# Patient Record
Sex: Female | Born: 1995 | Race: Black or African American | Hispanic: No | Marital: Single | Smoking: Former smoker
Health system: Southern US, Community
[De-identification: ages and names within clinical notes are randomized; demographics above are authoritative.]

## PROBLEM LIST (undated history)

## (undated) DIAGNOSIS — B009 Herpesviral infection, unspecified: Secondary | ICD-10-CM

## (undated) DIAGNOSIS — D573 Sickle-cell trait: Secondary | ICD-10-CM

## (undated) HISTORY — DX: Herpesviral infection, unspecified: B00.9

## (undated) HISTORY — PX: NO PAST SURGERIES: SHX2092

---
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
## 2017-01-14 ENCOUNTER — Emergency Department (HOSPITAL_COMMUNITY)
Admission: EM | Admit: 2017-01-14 | Discharge: 2017-01-14 | Disposition: A | Discharge status: Home / Self Care | Payer: Medicaid Other | Attending: Emergency Medicine | Admitting: Emergency Medicine

## 2017-01-14 ENCOUNTER — Encounter (HOSPITAL_COMMUNITY): Payer: Self-pay

## 2017-01-14 DIAGNOSIS — N939 Abnormal uterine and vaginal bleeding, unspecified: Secondary | ICD-10-CM | POA: Diagnosis not present

## 2017-01-14 DIAGNOSIS — Z79899 Other long term (current) drug therapy: Secondary | ICD-10-CM | POA: Insufficient documentation

## 2017-01-14 DIAGNOSIS — L210 Seborrhea capitis: Secondary | ICD-10-CM

## 2017-01-14 DIAGNOSIS — L42 Pityriasis rosea: Secondary | ICD-10-CM | POA: Insufficient documentation

## 2017-01-14 DIAGNOSIS — R21 Rash and other nonspecific skin eruption: Secondary | ICD-10-CM | POA: Diagnosis present

## 2017-01-14 MED ORDER — HYDROXYZINE HCL 25 MG PO TABS: 25.0000 mg | ORAL_TABLET | Freq: Four times a day (QID) | ORAL | 0 refills | Status: DC

## 2017-01-14 NOTE — Discharge Instructions (Signed)
How is this treated? Usually, treatment is not needed for this condition. The rash will probably go away on its own in 4-8 weeks. In some cases, a health care provider may recommend or prescribe medicine to reduce itching. Follow these instructions at home:  Take medicines only as directed by your health care provider.  Avoid scratching the affected areas of skin.  Do not take hot baths or use a sauna. Use only warm water when bathing or showering. Heat can increase itching. Contact a health care provider if:  Your rash does not go away in 8 weeks.  Your rash gets much worse.  You have a fever.  You have swelling or pain in the rash area.  You have fluid, blood, or pus coming from the rash area.

## 2017-01-14 NOTE — ED Provider Notes (Signed)
[facility] Provider Note    By signing my name below, I, Earmon Phoenix, attest that this documentation has been prepared under the direction and in the presence of Arthor Captain, PA-C. Electronically Signed: Earmon Phoenix, ED Scribe. 01/14/17. 3:22 PM.   History   Chief Complaint Chief Complaint  Patient presents with  . Rash    HPI  Theresa Lam is a 21 y.o. female who presents to the Emergency Department complaining of a worsening rash to the face and chest that has been present for the past two weeks. She reports associated itching. She also reports she has recently started her period for the third time in one month. She states she received the Depo Provera injection 6 months ago and is no longer on any type of birth control. She has not taken anything for her symptoms. There are no modifying factors noted. She denies fever, chills, nausea, vomiting, difficulty breathing or swallowing, drainage or bleeding from the wounds. She does not have a OB/GYN and denies being sexually active in the last nine months.   Past Medical History:  Diagnosis Date  . Sickle cell trait [facility])     Patient Active Problem List   Diagnosis Date Noted  . Fetal distress 03/30/2016  . Supervision of normal first pregnancy 09/08/2015    Past Surgical History:  Procedure Laterality Date  . NO PAST SURGERIES      OB History    Gravida Para Term Preterm AB Living   SAB TAB Ectopic Multiple Live Births         0 1       Home Medications    Prior to Admission medications   Medication Sig Start Date End Date Taking? Authorizing Provider  acetaminophen (TYLENOL) 500 MG tablet Take 1,000 mg by mouth every 6 (six) hours as needed for mild pain.    Historical Provider, MD  cephALEXin (KEFLEX) 500 MG capsule Take 1 capsule (500 mg total) by mouth 4 (four) times daily. Patient not taking: Reported on 06/20/2016 06/14/16   Danelle Berry, PA-C  hydrOXYzine  (ATARAX/VISTARIL) 25 MG tablet Take 1 tablet (25 mg total) by mouth every 6 (six) hours. 12/31/16   Deatra Canter, FNP  ibuprofen (ADVIL,MOTRIN) 600 MG tablet Take 1 tablet (600 mg total) by mouth every 6 (six) hours. Patient not taking: Reported on 06/20/2016 04/01/16   Montez Morita, CNM  medroxyPROGESTERone (DEPO-PROVERA) 150 MG/ML injection Inject 1 mL (150 mg total) into the muscle every 3 (three) months. 06/15/16   Brock Bad, MD  Prenatal Vit-Fe Phos-FA-Omega (VITAFOL GUMMIES) 3.33-0.333-34.8 MG CHEW Chew 3 tablets by mouth daily. 06/01/16   Brock Bad, MD  traMADol (ULTRAM) 50 MG tablet Take 1 tablet (50 mg total) by mouth every 12 (twelve) hours as needed for severe pain. Patient not taking: Reported on 06/20/2016 06/14/16   Danelle Berry, PA-C    Family History No family history on file.  Social History Social History  Substance Use Topics  . Smoking status: Never Smoker  . Smokeless tobacco: Never Used  . Alcohol use No     Allergies   Patient has no known allergies.   Review of Systems Review of Systems  Constitutional: Negative for chills and fever.  HENT: Negative for trouble swallowing.   Respiratory: Negative for shortness of breath.   Gastrointestinal: Negative for nausea and vomiting.  Skin: Positive for rash.     Physical Exam  Updated Vital Signs BP 115/73 (BP Location: Left Arm)   Pulse 66   Temp 98.3 F (36.8 C) (Oral)   Resp 18   Ht  (1.727 m)   Wt 157 lb (71.2 kg)   LMP 12/16/2016   SpO2 100%   BMI 23.87 kg/m   Physical Exam  Constitutional: She is oriented to person, place, and time. She appears well-developed and well-nourished.  HENT:  Head: Normocephalic and atraumatic.  Neck: Normal range of motion.  Cardiovascular: Normal rate.   Pulmonary/Chest: Effort normal.  Musculoskeletal: Normal range of motion.  Neurological: She is alert and oriented to person, place, and time.  Skin: Skin is warm and dry. Rash noted.    Multiple, singular, fine plaques with fine scale over face, chest, abdomen and upper arms. Large herald patch in left axilla.  Psychiatric: She has a normal mood and affect. Her behavior is normal.  Nursing note and vitals reviewed.    ED Treatments / Results  DIAGNOSTIC STUDIES: Oxygen Saturation is 100% on RA, normal by my interpretation.   COORDINATION OF CARE: 3:13 PM- Will treat symptomatically. Will refer to [facility] clinic. Pt verbalizes understanding and agrees to plan.  Medications - No data to display  Labs (all labs ordered are listed, but only abnormal results are displayed) Labs Reviewed - No data to display  EKG  EKG Interpretation None       Radiology No results found.  Procedures Procedures (including critical care time)  Medications Ordered in ED Medications - No data to display   Initial Impression / Assessment and Plan / ED Course  I have reviewed the triage vital signs and the nursing notes.  Pertinent labs & imaging results that were available during my care of the patient were reviewed by me and considered in my medical decision making (see chart for details).     Patient with pityriasis rosea. No signs of secondary infection. Discharged with symptomatic treatment. Follow up with PCP in 2-3 days. Return precautions discussed. Pt is safe for discharge at this time.     Final Clinical Impressions(s) / ED Diagnoses   Final diagnoses:  Pityriasis  Abnormal uterine bleeding    New Prescriptions New Prescriptions   No medications on file       Arthor Captain, PA-C 01/14/17 1622    Benjiman Core, MD 01/14/17 2012

## 2017-01-14 NOTE — ED Triage Notes (Signed)
Per Pt, Pt is coming from home with macular rash x 2 weeks. Denies any known allergies. Noted on chest and torso.

## 2017-06-06 ENCOUNTER — Emergency Department (HOSPITAL_COMMUNITY): Payer: Medicaid Other

## 2017-06-06 ENCOUNTER — Emergency Department (HOSPITAL_COMMUNITY)
Admission: EM | Admit: 2017-06-06 | Discharge: 2017-06-06 | Disposition: A | Discharge status: Home / Self Care | Payer: Medicaid Other | Attending: Emergency Medicine | Admitting: Emergency Medicine

## 2017-06-06 ENCOUNTER — Encounter (HOSPITAL_COMMUNITY): Payer: Self-pay | Admitting: Emergency Medicine

## 2017-06-06 DIAGNOSIS — Y9389 Activity, other specified: Secondary | ICD-10-CM | POA: Insufficient documentation

## 2017-06-06 DIAGNOSIS — Z79899 Other long term (current) drug therapy: Secondary | ICD-10-CM | POA: Insufficient documentation

## 2017-06-06 DIAGNOSIS — D573 Sickle-cell trait: Secondary | ICD-10-CM | POA: Insufficient documentation

## 2017-06-06 DIAGNOSIS — S99821A Other specified injuries of right foot, initial encounter: Secondary | ICD-10-CM | POA: Insufficient documentation

## 2017-06-06 DIAGNOSIS — W1789XA Other fall from one level to another, initial encounter: Secondary | ICD-10-CM | POA: Insufficient documentation

## 2017-06-06 DIAGNOSIS — S99921A Unspecified injury of right foot, initial encounter: Secondary | ICD-10-CM | POA: Diagnosis present

## 2017-06-06 DIAGNOSIS — Y929 Unspecified place or not applicable: Secondary | ICD-10-CM | POA: Insufficient documentation

## 2017-06-06 DIAGNOSIS — Y999 Unspecified external cause status: Secondary | ICD-10-CM | POA: Insufficient documentation

## 2017-06-06 MED ORDER — MELOXICAM 15 MG PO TABS: 15.0000 mg | ORAL_TABLET | Freq: Every day | ORAL | 0 refills | Status: DC

## 2017-06-06 NOTE — ED Triage Notes (Signed)
Pt reports jumping from chair to floor last night and injuring R foot. Swelling noted. Pulses and neuro intact.

## 2017-06-06 NOTE — ED Notes (Signed)
Ortho tech notified.  

## 2017-06-06 NOTE — ED Notes (Signed)
Ortho tech in. 

## 2017-06-06 NOTE — ED Notes (Signed)
Larey Seat out of a chair onto right foot last pm. Today right dorsal foot painful, swollen and discolored.

## 2017-06-06 NOTE — ED Provider Notes (Signed)
[facility] Provider Note   CSN: [id number] Arrival date & time: 06/06/17  1153     History   Chief Complaint Chief Complaint  Patient presents with  . Foot Injury    HPI Theresa Lam is a 21 y.o. female.Presents emergency Department with chief complaint of right foot pain. Patient states that she was standing on all stepstool yesterday when it fell. She jumped down and fell onto her right foot. She came down onto a pointed toe and had immediate severe pain and difficulty ambulating on the foot. She states that she has had severe mid foot and lateral foot pain with any attempt to ambulate. She has significant swelling. She denies any numbness, tingling. She has a history of previous ankle sprain.  HPI  Past Medical History:  Diagnosis Date  . Sickle cell trait [facility])     Patient Active Problem List   Diagnosis Date Noted  . Fetal distress 03/30/2016  . Supervision of normal first pregnancy 09/08/2015    Past Surgical History:  Procedure Laterality Date  . NO PAST SURGERIES      OB History    Gravida Para Term Preterm AB Living   SAB TAB Ectopic Multiple Live Births         0 1       Home Medications    Prior to Admission medications   Medication Sig Start Date End Date Taking? Authorizing Provider  acetaminophen (TYLENOL) 500 MG tablet Take 1,000 mg by mouth every 6 (six) hours as needed for mild pain.    [provider]  cephALEXin (KEFLEX) 500 MG capsule Take 1 capsule (500 mg total) by mouth 4 (four) times daily. Patient not taking: Reported on 06/20/2016 06/14/16   Danelle Berry, PA-C  hydrOXYzine (ATARAX/VISTARIL) 25 MG tablet Take 1 tablet (25 mg total) by mouth every 6 (six) hours. 01/14/17   Meadow Abramo, Cammy Copa, PA-C  ibuprofen (ADVIL,MOTRIN) 600 MG tablet Take 1 tablet (600 mg total) by mouth every 6 (six) hours. Patient not taking: Reported on 06/20/2016 04/01/16   Montez Morita, CNM  medroxyPROGESTERone (DEPO-PROVERA)  150 MG/ML injection Inject 1 mL (150 mg total) into the muscle every 3 (three) months. 06/15/16   Brock Bad, MD  meloxicam (MOBIC) 15 MG tablet Take 1 tablet (15 mg total) by mouth daily. Take 1 daily with food. 06/06/17   Arthor Captain, PA-C  Prenatal Vit-Fe Phos-FA-Omega (VITAFOL GUMMIES) 3.33-0.333-34.8 MG CHEW Chew 3 tablets by mouth daily. 06/01/16   Brock Bad, MD  traMADol (ULTRAM) 50 MG tablet Take 1 tablet (50 mg total) by mouth every 12 (twelve) hours as needed for severe pain. Patient not taking: Reported on 06/20/2016 06/14/16   Danelle Berry, PA-C    Family History No family history on file.  Social History Social History  Substance Use Topics  . Smoking status: Never Smoker  . Smokeless tobacco: Never Used  . Alcohol use No     Allergies   Patient has no known allergies.   Review of Systems Review of Systems  Musculoskeletal: Positive for gait problem and joint swelling.  Neurological: Negative for weakness and numbness.     Physical Exam Updated Vital Signs BP 103/65 (BP Location: Left Arm)   Pulse (!) 59   Temp 98.5 F (36.9 C) (Oral)   Resp 16   LMP 04/18/2017 (Approximate)   SpO2 100%   Physical Exam  Constitutional: She is oriented to  person, place, and time. She appears well-developed and well-nourished. No distress.  HENT:  Head: Normocephalic and atraumatic.  Eyes: Conjunctivae are normal. No scleral icterus.  Neck: Normal range of motion.  Cardiovascular: Normal rate, regular rhythm and normal heart sounds.  Exam reveals no gallop and no friction rub.   No murmur heard. Pulmonary/Chest: Effort normal and breath sounds normal. No respiratory distress.  Abdominal: Soft. Bowel sounds are normal. She exhibits no distension and no mass. There is no tenderness. There is no guarding.  Musculoskeletal:  Patient pulling tender over the proximal fifth and fourth metatarsals. There is marked swelling and ecchymosis.NVI Normal ROM of ankle    Neurological: She is alert and oriented to person, place, and time.  Skin: Skin is warm and dry. She is not diaphoretic.  Psychiatric: Her behavior is normal.  Nursing note and vitals reviewed.    ED Treatments / Results  Labs (all labs ordered are listed, but only abnormal results are displayed) Labs Reviewed - No data to display  EKG  EKG Interpretation None       Radiology Dg Foot Complete Right  Result Date: 06/06/2017 CLINICAL DATA:  Pain following fall EXAM: RIGHT FOOT COMPLETE - 3+ VIEW COMPARISON:  None. FINDINGS: Frontal, oblique, and lateral views were obtained. There is mild soft tissue swelling over the dorsal forefoot. There is no fracture or dislocation. Joint spaces appear normal. No erosive change. IMPRESSION: Soft tissue swelling dorsal forefoot region. No fracture or dislocation. No evident arthropathic change. Electronically Signed   By: Bretta Bang III M.D.   On: 06/06/2017 13:19    Procedures Procedures (including critical care time)  Medications Ordered in ED Medications - No data to display  SPLINT APPLICATION Date/Time: 3:51 PM Authorized by: Arthor Captain Consent: Verbal consent obtained. Risks and benefits: risks, benefits and alternatives were discussed Consent given by: patient Splint applied by: orthopedic technician Location details: R foot Splint type: short leg Post-procedure: The splinted body part was neurovascularly unchanged following the procedure. Patient tolerance: Patient tolerated the procedure well with no immediate complications.    Initial Impression / Assessment and Plan / ED Course  I have reviewed the triage vital signs and the nursing notes.  Pertinent labs & imaging results that were available during my care of the patient were reviewed by me and considered in my medical decision making (see chart for details).     .Patient with mid foot injury. I have concern for potential Lisfranc fracture or occult  fracture of the mid foot at this time. Patient will be placed in a stirrup and posterior splint, crutches and nonweightbearing. She is to follow-up with orthopedics in about 1 week. I discussed return precautions with the patient she is neurovascularly intact after splint application. Safe for discharge at this time  Final Clinical Impressions(s) / ED Diagnoses   Final diagnoses:  Injury of right foot, initial encounter    New Prescriptions New Prescriptions   MELOXICAM (MOBIC) 15 MG TABLET    Take 1 tablet (15 mg total) by mouth daily. Take 1 daily with food.     Arthor Captain, PA-C 06/06/17 1551    Azalia Bilis, MD 06/06/17 1731

## 2017-06-06 NOTE — Progress Notes (Signed)
Orthopedic Tech Progress Note Patient Details:  Theresa Lam 1996/09/14 [id number]  Ortho Devices Type of Ortho Device: Crutches, Post (short leg) splint Ortho Device/Splint Location: applied short leg splint (with stirrup) TO PT right leg/ankle.  pt tolerated application well.  fitted and trained pt for crutch use.  pt ambulated very well.  right leg. Ortho Device/Splint Interventions: Application, Adjustment   [name] 06/06/2017, 3:21 PM

## 2017-06-06 NOTE — Discharge Instructions (Signed)
Do not bear weight on the Foot.  Follow closely with Dr. Aundria Rud. Turned to the emergency department if you develop any numbness, tingling, pain out of proportion.

## 2017-11-11 IMAGING — DX DG CHEST 2V
2 series · 2 of 2 positions shown · non-contrast
Comparison: 06/13/2016

CLINICAL DATA: Fever starting last night, cough

EXAM:
CHEST  2 VIEW

[w chest pa]
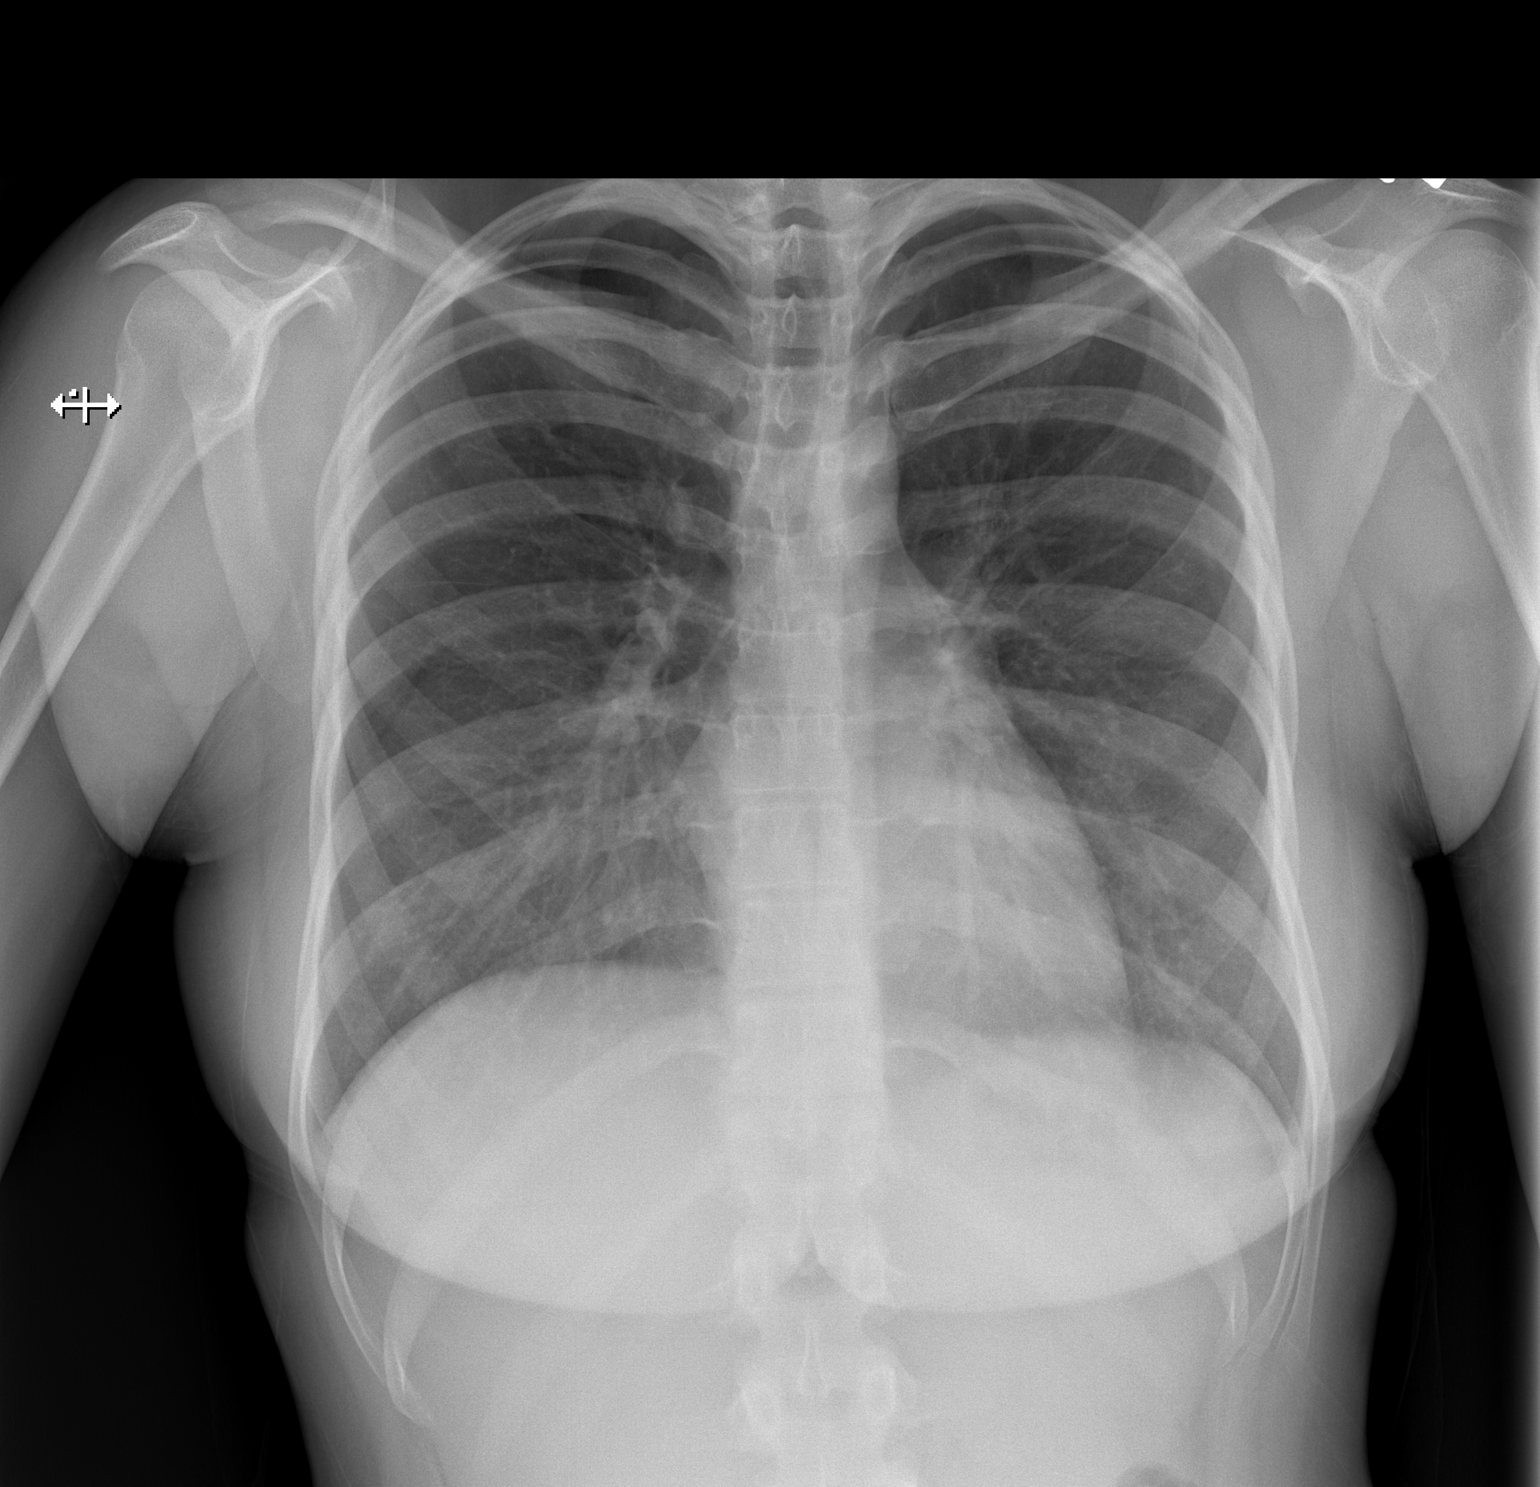

[w chest lat]
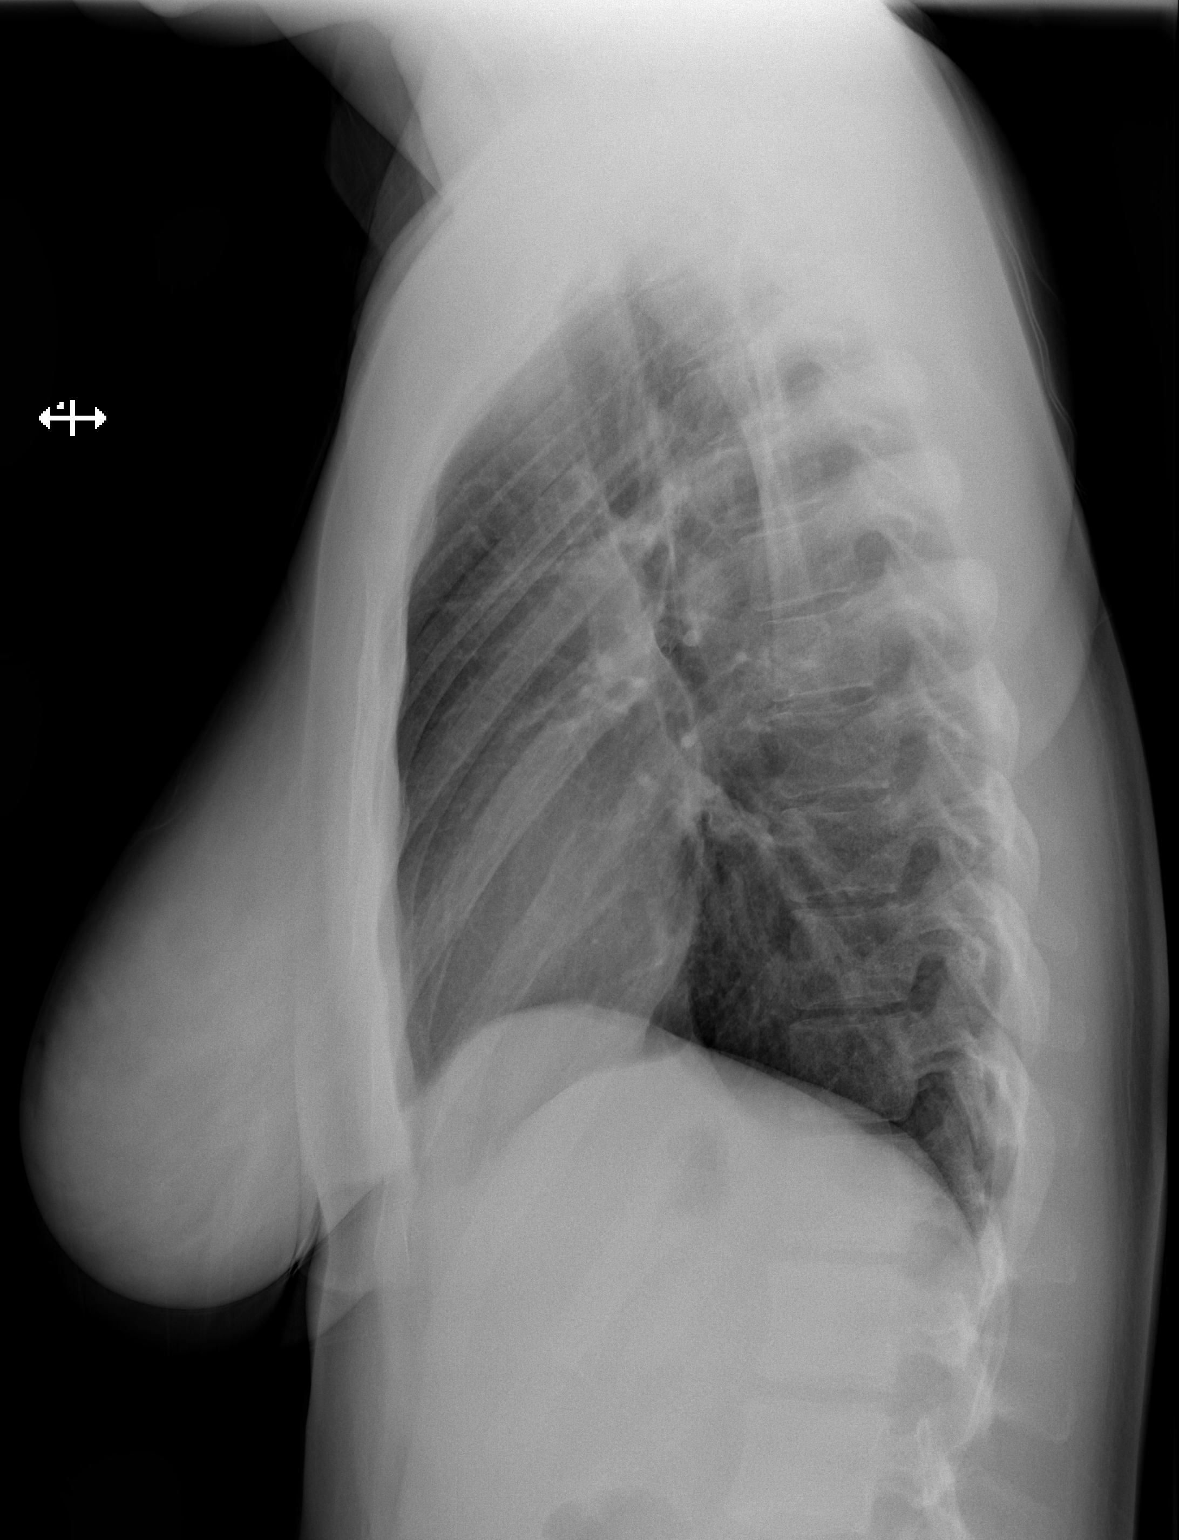

[2 of 2 positions shown; findings below may reference images not displayed]

FINDINGS: Cardiomediastinal silhouette is stable. No acute infiltrate or
pleural effusion. No pulmonary edema. Bony thorax is unremarkable.
IMPRESSION: No active cardiopulmonary disease.

## 2017-11-11 IMAGING — DX DG ABDOMEN 1V
1 series · 1 of 1 positions shown · non-contrast
Comparison: Abdomen 08/01/2014.

CLINICAL DATA: Fever.

EXAM:
ABDOMEN - 1 VIEW

[t abdomen supine]
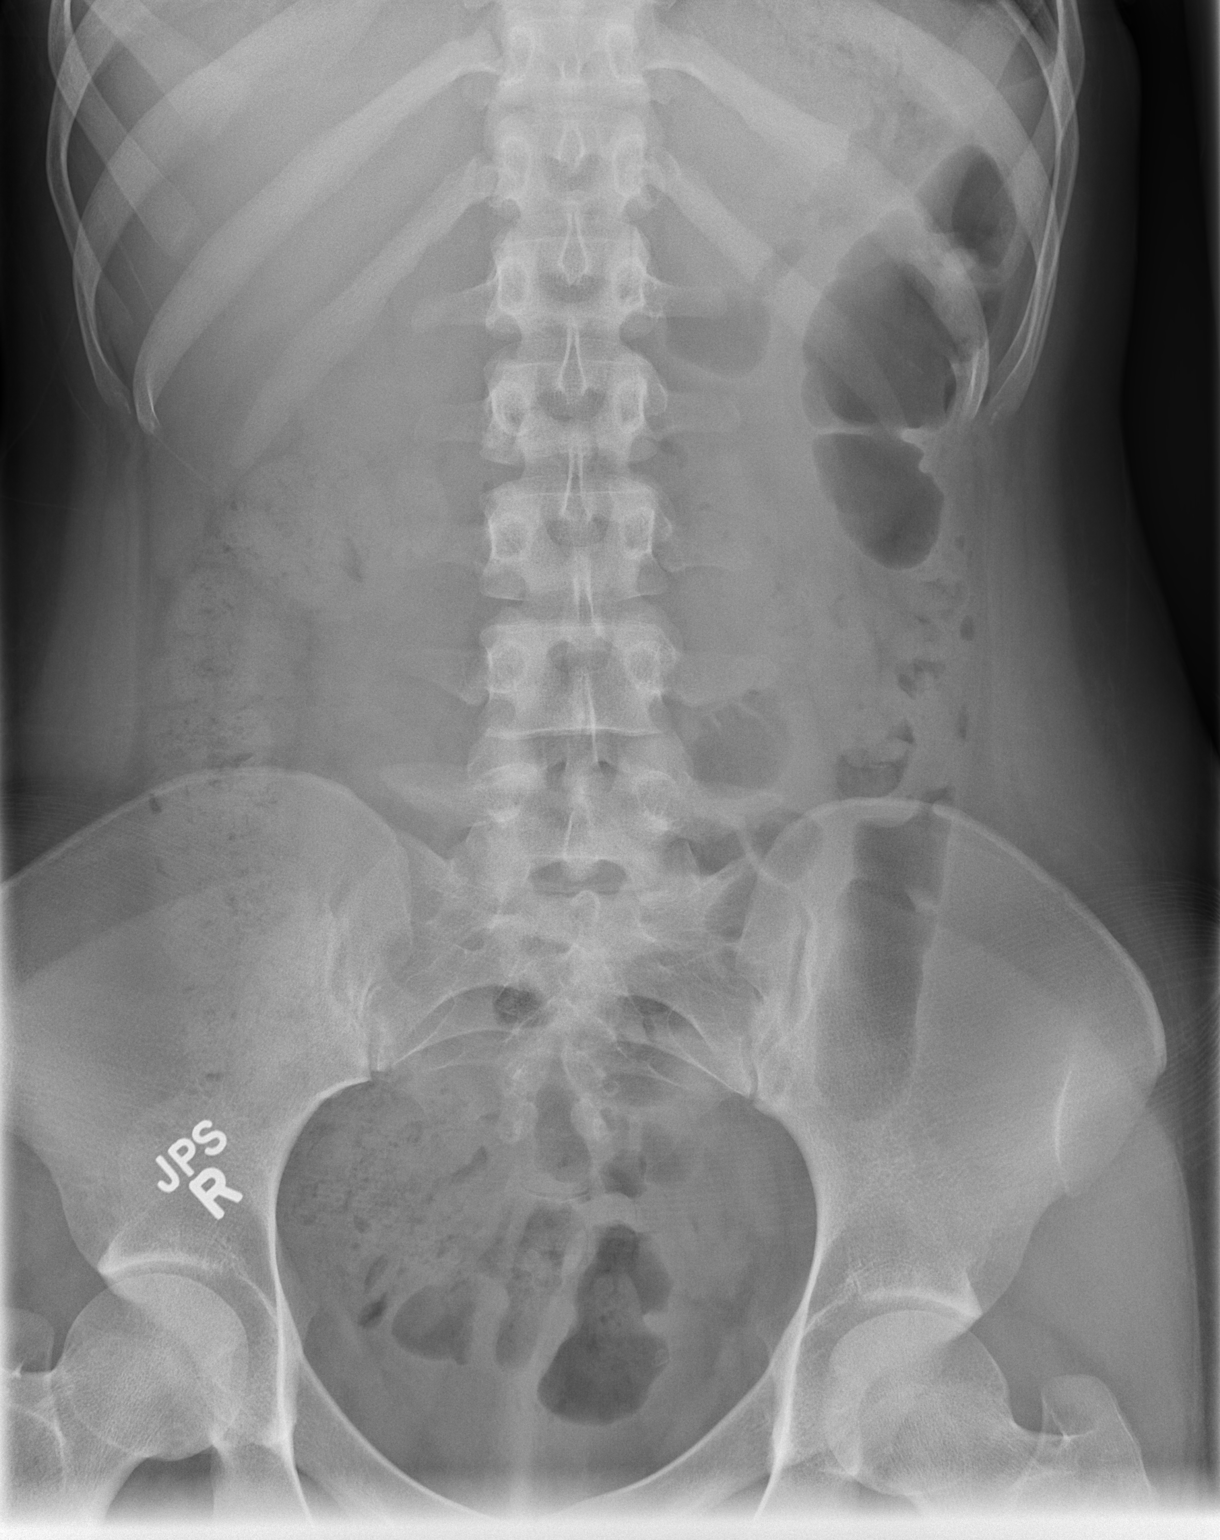

[1 of 1 positions shown; findings below may reference images not displayed]

FINDINGS: Soft tissue structures are unremarkable. No bowel distention. No
free air. Stool noted throughout the colon. No acute bony
abnormality .
IMPRESSION: Stool noted throughout the colon.  No acute abnormality.

## 2019-09-01 ENCOUNTER — Other Ambulatory Visit: Payer: Self-pay

## 2019-09-01 ENCOUNTER — Encounter (HOSPITAL_COMMUNITY): Payer: Self-pay

## 2019-09-01 ENCOUNTER — Ambulatory Visit (HOSPITAL_COMMUNITY)
Admission: EM | Admit: 2019-09-01 | Discharge: 2019-09-01 | Disposition: A | Discharge status: Home / Self Care | Payer: Medicaid Other | Attending: Family Medicine | Admitting: Family Medicine

## 2019-09-01 DIAGNOSIS — J020 Streptococcal pharyngitis: Secondary | ICD-10-CM | POA: Insufficient documentation

## 2019-09-01 DIAGNOSIS — J029 Acute pharyngitis, unspecified: Secondary | ICD-10-CM | POA: Diagnosis not present

## 2019-09-01 DIAGNOSIS — Z793 Long term (current) use of hormonal contraceptives: Secondary | ICD-10-CM | POA: Insufficient documentation

## 2019-09-01 DIAGNOSIS — D573 Sickle-cell trait: Secondary | ICD-10-CM | POA: Diagnosis not present

## 2019-09-01 DIAGNOSIS — Z20828 Contact with and (suspected) exposure to other viral communicable diseases: Secondary | ICD-10-CM | POA: Diagnosis not present

## 2019-09-01 DIAGNOSIS — F17210 Nicotine dependence, cigarettes, uncomplicated: Secondary | ICD-10-CM | POA: Diagnosis not present

## 2019-09-01 LAB — POCT RAPID STREP A: Streptococcus, Group A Screen (Direct): POSITIVE — AB

## 2019-09-01 MED ORDER — AMOXICILLIN 500 MG PO CAPS: 1000.0000 mg | ORAL_CAPSULE | Freq: Every day | ORAL | 0 refills | Status: AC

## 2019-09-01 MED ORDER — IBUPROFEN 600 MG PO TABS: 600.0000 mg | ORAL_TABLET | Freq: Four times a day (QID) | ORAL | 0 refills | Status: DC

## 2019-09-01 NOTE — Discharge Instructions (Signed)
Treating you for strep throat.  Take the medication as prescribed. Follow up as needed for continued or worsening symptoms

## 2019-09-01 NOTE — ED Provider Notes (Signed)
[facility]    CSN: [id number] Arrival date & time: 09/01/19  1059      History   Chief Complaint Chief Complaint  Patient presents with  . Sore Throat    HPI Theresa Lam is a 23 y.o. female.   Pt is a 23 year old female the presents today for 2 days sore throat. Symptoms have been constant. She has been using Motrin without much relief. Reporting white patches in the back of her throat. Denies any trouble swallowing or breathing. No fever, chills or body aches. Low grade fever here today.   ROS per HPI      Past Medical History:  Diagnosis Date  . Sickle cell trait [facility])     Patient Active Problem List   Diagnosis Date Noted  . Fetal distress 03/30/2016  . Supervision of normal first pregnancy 09/08/2015    Past Surgical History:  Procedure Laterality Date  . NO PAST SURGERIES      OB History    Gravida  1   Para  1   Term  1   Preterm      AB      Living  1     SAB      TAB      Ectopic      Multiple  0   Live Births  1            Home Medications    Prior to Admission medications   Medication Sig Start Date End Date Taking? Authorizing Provider  acetaminophen (TYLENOL) 500 MG tablet Take 1,000 mg by mouth every 6 (six) hours as needed for mild pain.    [provider]  amoxicillin (AMOXIL) 500 MG capsule Take 2 capsules (1,000 mg total) by mouth daily for 10 days. 09/01/19 09/11/19  Dahlia Byes A, NP  hydrOXYzine (ATARAX/VISTARIL) 25 MG tablet Take 1 tablet (25 mg total) by mouth every 6 (six) hours. 01/14/17   Harris, Cammy Copa, PA-C  ibuprofen (ADVIL) 600 MG tablet Take 1 tablet (600 mg total) by mouth every 6 (six) hours. 09/01/19   Dahlia Byes A, NP  medroxyPROGESTERone (DEPO-PROVERA) 150 MG/ML injection Inject 1 mL (150 mg total) into the muscle every 3 (three) months. 06/15/16   Brock Bad, MD  Prenatal Vit-Fe Phos-FA-Omega (VITAFOL GUMMIES) 3.33-0.333-34.8 MG CHEW Chew 3 tablets by mouth  daily. 06/01/16   Brock Bad, MD    Family History Family History  Problem Relation Age of Onset  . Healthy Mother   . Cancer Father     Social History Social History   Tobacco Use  . Smoking status: Current Every Day Smoker    Types: Cigars  . Smokeless tobacco: Never Used  Substance Use Topics  . Alcohol use: No  . Drug use: Yes    Types: Marijuana     Allergies   Patient has no known allergies.   Review of Systems Review of Systems   Physical Exam Triage Vital Signs ED Triage Vitals  Enc Vitals Group     BP 09/01/19 1221 110/60     Pulse Rate 09/01/19 1221 64     Resp 09/01/19 1221 15     Temp 09/01/19 1221 99.1 F (37.3 C)     Temp Source 09/01/19 1221 Oral     SpO2 09/01/19 1221 98 %     Weight --      Height --      Head Circumference --  Peak Flow --      Pain Score 09/01/19 1219 9     Pain Loc --      Pain Edu? --      Excl. in [place]? --    No data found.  Updated Vital Signs BP 110/60 (BP Location: Right Arm)   Pulse 64   Temp 99.1 F (37.3 C) (Oral)   Resp 15   SpO2 98%   Visual Acuity Right Eye Distance:   Left Eye Distance:   Bilateral Distance:    Right Eye Near:   Left Eye Near:    Bilateral Near:     Physical Exam Vitals and nursing note reviewed.  Constitutional:      General: She is not in acute distress.    Appearance: She is not ill-appearing, toxic-appearing or diaphoretic.  HENT:     Head: Normocephalic and atraumatic.     Mouth/Throat:     Pharynx: Uvula midline. Posterior oropharyngeal erythema present.     Tonsils: Tonsillar exudate present. 2+ on the right. 2+ on the left.  Eyes:     Conjunctiva/sclera: Conjunctivae normal.  Pulmonary:     Effort: Pulmonary effort is normal.  Skin:    General: Skin is warm and dry.  Neurological:     Mental Status: She is alert.  Psychiatric:        Mood and Affect: Mood normal.      UC Treatments / Results  Labs (all labs ordered are listed, but only  abnormal results are displayed) Labs Reviewed  POCT RAPID STREP A - Abnormal; Notable for the following components:      Result Value   Streptococcus, Group A Screen (Direct) POSITIVE (*)    All other components within normal limits  NOVEL CORONAVIRUS, NAA (HOSP ORDER, SEND-OUT TO REF LAB; TAT 18-24 HRS)    EKG   Radiology No results found.  Procedures Procedures (including critical care time)  Medications Ordered in UC Medications - No data to display  Initial Impression / Assessment and Plan / UC Course  I have reviewed the triage vital signs and the nursing notes.  Pertinent labs & imaging results that were available during my care of the patient were reviewed by me and considered in my medical decision making (see chart for details).     Strep throat-rapid strep test positive here. Treating with amoxicillin Advil for pain, inflammation Recommend warm salt water gargles Follow up as needed for continued or worsening symptoms  Final Clinical Impressions(s) / UC Diagnoses   Final diagnoses:  Strep throat     Discharge Instructions     Treating you for strep throat.  Take the medication as prescribed. Follow up as needed for continued or worsening symptoms     ED Prescriptions    Medication Sig Dispense Auth. Provider   ibuprofen (ADVIL) 600 MG tablet Take 1 tablet (600 mg total) by mouth every 6 (six) hours. 30 tablet Clarissia Mckeen A, NP   amoxicillin (AMOXIL) 500 MG capsule Take 2 capsules (1,000 mg total) by mouth daily for 10 days. 20 capsule Loura Halt A, NP     PDMP not reviewed this encounter.   Orvan July, NP 09/02/19 [phone]

## 2019-09-01 NOTE — ED Triage Notes (Signed)
Patient presents to Urgent Care with complaints of sore throat since two days ago. Patient reports she has been using a throat spray and taking motrin for her pain but it's not helping, pt states she can see white patches on the back of her throat.

## 2019-09-03 LAB — NOVEL CORONAVIRUS, NAA (HOSP ORDER, SEND-OUT TO REF LAB; TAT 18-24 HRS): SARS-CoV-2, NAA: NOT DETECTED

## 2020-09-15 ENCOUNTER — Emergency Department (HOSPITAL_COMMUNITY)
Admission: EM | Admit: 2020-09-15 | Discharge: 2020-09-15 | Disposition: A | Discharge status: Home / Self Care | Payer: Medicaid Other | Attending: Emergency Medicine | Admitting: Emergency Medicine

## 2020-09-15 ENCOUNTER — Other Ambulatory Visit: Payer: Self-pay

## 2020-09-15 DIAGNOSIS — Z5321 Procedure and treatment not carried out due to patient leaving prior to being seen by health care provider: Secondary | ICD-10-CM | POA: Diagnosis not present

## 2020-09-15 DIAGNOSIS — M791 Myalgia, unspecified site: Secondary | ICD-10-CM | POA: Diagnosis not present

## 2020-09-15 NOTE — ED Triage Notes (Signed)
Patient complains of body aches that started this morning, states her daughter tested postive for COVID yesterday.

## 2020-09-15 NOTE — ED Notes (Signed)
Pt no answer for ready room.  

## 2021-11-20 ENCOUNTER — Inpatient Hospital Stay (HOSPITAL_COMMUNITY): Payer: Medicaid Other

## 2021-11-20 ENCOUNTER — Encounter (HOSPITAL_COMMUNITY): Payer: Self-pay | Admitting: *Deleted

## 2021-11-20 ENCOUNTER — Inpatient Hospital Stay (HOSPITAL_COMMUNITY)
Admission: AD | Admit: 2021-11-20 | Discharge: 2021-11-20 | Disposition: A | Discharge status: Home / Self Care | Payer: Medicaid Other | Attending: Family Medicine | Admitting: Family Medicine

## 2021-11-20 ENCOUNTER — Other Ambulatory Visit: Payer: Self-pay

## 2021-11-20 DIAGNOSIS — O26891 Other specified pregnancy related conditions, first trimester: Secondary | ICD-10-CM | POA: Diagnosis not present

## 2021-11-20 DIAGNOSIS — R109 Unspecified abdominal pain: Secondary | ICD-10-CM

## 2021-11-20 DIAGNOSIS — O219 Vomiting of pregnancy, unspecified: Secondary | ICD-10-CM | POA: Diagnosis not present

## 2021-11-20 DIAGNOSIS — Z3491 Encounter for supervision of normal pregnancy, unspecified, first trimester: Secondary | ICD-10-CM | POA: Diagnosis not present

## 2021-11-20 DIAGNOSIS — Z3A01 Less than 8 weeks gestation of pregnancy: Secondary | ICD-10-CM | POA: Diagnosis not present

## 2021-11-20 LAB — URINALYSIS, ROUTINE W REFLEX MICROSCOPIC
Bilirubin Urine: NEGATIVE
Glucose, UA: NEGATIVE mg/dL
Hgb urine dipstick: NEGATIVE
Ketones, ur: NEGATIVE mg/dL
Nitrite: NEGATIVE
Protein, ur: NEGATIVE mg/dL
Specific Gravity, Urine: 1.016 (ref 1.005–1.030)
pH: 6 (ref 5.0–8.0)

## 2021-11-20 LAB — CBC
HCT: 35 % — ABNORMAL LOW (ref 36.0–46.0)
Hemoglobin: 11.7 g/dL — ABNORMAL LOW (ref 12.0–15.0)
MCH: 27.9 pg (ref 26.0–34.0)
MCHC: 33.4 g/dL (ref 30.0–36.0)
MCV: 83.5 fL (ref 80.0–100.0)
Platelets: 187 10*3/uL (ref 150–400)
RBC: 4.19 MIL/uL (ref 3.87–5.11)
RDW: 13.4 % (ref 11.5–15.5)
WBC: 5 10*3/uL (ref 4.0–10.5)
nRBC: 0 % (ref 0.0–0.2)

## 2021-11-20 LAB — RH IG WORKUP (INCLUDES ABO/RH)
ABO/RH(D): B NEG
Antibody Screen: NEGATIVE
Gestational Age(Wks): 7
Unit division: 0

## 2021-11-20 LAB — WET PREP, GENITAL
Clue Cells Wet Prep HPF POC: NONE SEEN
Sperm: NONE SEEN
Trich, Wet Prep: NONE SEEN
WBC, Wet Prep HPF POC: >=10 — AB (ref <10)
Yeast Wet Prep HPF POC: NONE SEEN

## 2021-11-20 LAB — POCT PREGNANCY, URINE: Preg Test, Ur: POSITIVE — AB

## 2021-11-20 LAB — HCG, QUANTITATIVE, PREGNANCY: hCG, Beta Chain, Quant, S: 71621 m[IU]/mL — ABNORMAL HIGH (ref <5)

## 2021-11-20 MED ORDER — DOXYLAMINE-PYRIDOXINE 10-10 MG PO TBEC: 2.0000 | DELAYED_RELEASE_TABLET | Freq: Every day | ORAL | 5 refills | Status: DC

## 2021-11-20 NOTE — MAU Provider Note (Signed)
?History  ?  ? ?[id number] ? ?Arrival date and time: 11/20/21 1050 ?  ? ?Chief Complaint  ?Patient presents with  ? Abdominal Pain  ? ? ? ?HPI ?Theresa Lam is a 26 y.o. at [redacted]w[redacted]d by LMP with PMHx notable for sickle cell trait, who presents for abdominal cramping. ?Reports lower abdominal cramping started 2 days ago. Rates pain 5/10. Has been taking extra strength tylenol with some relief. Also reports some nausea, no vomiting.   ?Denies fever, diarrhea, constipation, dysuria, vaginal bleeding, or vaginal discharge. Does not have an OB. ? ?OB History   ? ? Gravida  ?2  ? Para  ?1  ? Term  ?1  ? Preterm  ?   ? AB  ?   ? Living  ?1  ?  ? ? SAB  ?   ? IAB  ?   ? Ectopic  ?   ? Multiple  ?0  ? Live Births  ?1  ?   ?  ?  ? ? ?Past Medical History:  ?Diagnosis Date  ? Sickle cell trait (HCC)   ? ? ?Past Surgical History:  ?Procedure Laterality Date  ? NO PAST SURGERIES    ? ? ?Family History  ?Problem Relation Age of Onset  ? Healthy Mother   ? Cancer Father   ? ? ?No Known Allergies ? ?No current facility-administered medications on file prior to encounter.  ? ?Current Outpatient Medications on File Prior to Encounter  ?Medication Sig Dispense Refill  ? acetaminophen (TYLENOL) 500 MG tablet Take 1,000 mg by mouth every 6 (six) hours as needed for mild pain.    ? Prenatal Vit-Fe Phos-FA-Omega (VITAFOL GUMMIES) 3.33-0.333-34.8 MG CHEW Chew 3 tablets by mouth daily. 90 tablet 12  ? valACYclovir (VALTREX) 1000 MG tablet valacyclovir 1 gram tablet ? TAKE 1 TABLET BY MOUTH EVERY 12 HOURS FOR 10 DAYS    ? ? ? ?ROS ?Pertinent positives and negative per HPI, all others reviewed and negative ? ?Physical Exam  ? ?BP 111/65 (BP Location: Right Arm)   Pulse 67   Temp 98.6 ?F (37 ?C) (Oral)   Resp 16   Ht 5\' 8"  (1.727 m)   Wt 75 kg   LMP 09/30/2021   SpO2 98%   BMI 25.13 kg/m?  ? ?Patient Vitals for the past 24 hrs: ? BP Temp Temp src Pulse Resp SpO2 Height Weight  ?11/20/21 1256 111/65 -- -- 67 16 -- -- --  ?11/20/21  1127 108/62 -- -- 65 16 98 % -- --  ?11/20/21 1111 109/61 98.6 ?F (37 ?C) Oral 71 18 99 % -- --  ?11/20/21 1105 -- -- -- -- -- -- 5\' 8"  (1.727 m) 75 kg  ? ? ?Physical Exam ?Vitals and nursing note reviewed.  ?Constitutional:   ?   General: She is not in acute distress. ?   Appearance: She is well-developed.  ?HENT:  ?   Head: Normocephalic and atraumatic.  ?Pulmonary:  ?   Effort: Pulmonary effort is normal. No respiratory distress.  ?Abdominal:  ?   General: Abdomen is flat. Bowel sounds are normal.  ?   Palpations: Abdomen is soft.  ?   Tenderness: There is no abdominal tenderness. There is no guarding or rebound.  ?Skin: ?   General: Skin is warm and dry.  ?Neurological:  ?   Mental Status: She is alert.  ?Psychiatric:     ?   Mood and Affect: Mood normal.     ?  Behavior: Behavior normal.  ? ? ?Labs ?Results for orders placed or performed during the hospital encounter of 11/20/21 (from the past 24 hour(s))  ?Pregnancy, urine POC     Status: Abnormal  ? Collection Time: 11/20/21 11:07 AM  ?Result Value Ref Range  ? Preg Test, Ur POSITIVE (A) NEGATIVE  ?Urinalysis, Routine w reflex microscopic Urine, Clean Catch     Status: Abnormal  ? Collection Time: 11/20/21 11:16 AM  ?Result Value Ref Range  ? Color, Urine YELLOW YELLOW  ? APPearance CLOUDY (A) CLEAR  ? Specific Gravity, Urine 1.016 1.005 - 1.030  ? pH 6.0 5.0 - 8.0  ? Glucose, UA NEGATIVE NEGATIVE mg/dL  ? Hgb urine dipstick NEGATIVE NEGATIVE  ? Bilirubin Urine NEGATIVE NEGATIVE  ? Ketones, ur NEGATIVE NEGATIVE mg/dL  ? Protein, ur NEGATIVE NEGATIVE mg/dL  ? Nitrite NEGATIVE NEGATIVE  ? Leukocytes,Ua LARGE (A) NEGATIVE  ? RBC / HPF 0-5 0 - 5 RBC/hpf  ? WBC, UA 11-20 0 - 5 WBC/hpf  ? Bacteria, UA RARE (A) NONE SEEN  ? Squamous Epithelial / LPF 21-50 0 - 5  ? Mucus PRESENT   ?Wet prep, genital     Status: Abnormal  ? Collection Time: 11/20/21 11:41 AM  ? Specimen: Vaginal  ?Result Value Ref Range  ? Yeast Wet Prep HPF POC NONE SEEN NONE SEEN  ? Trich, Wet Prep  NONE SEEN NONE SEEN  ? Clue Cells Wet Prep HPF POC NONE SEEN NONE SEEN  ? WBC, Wet Prep HPF POC >=10 (A) <10  ? Sperm NONE SEEN   ?CBC     Status: Abnormal  ? Collection Time: 11/20/21 11:57 AM  ?Result Value Ref Range  ? WBC 5.0 4.0 - 10.5 K/uL  ? RBC 4.19 3.87 - 5.11 MIL/uL  ? Hemoglobin 11.7 (L) 12.0 - 15.0 g/dL  ? HCT 35.0 (L) 36.0 - 46.0 %  ? MCV 83.5 80.0 - 100.0 fL  ? MCH 27.9 26.0 - 34.0 pg  ? MCHC 33.4 30.0 - 36.0 g/dL  ? RDW 13.4 11.5 - 15.5 %  ? Platelets 187 150 - 400 K/uL  ? nRBC 0.0 0.0 - 0.2 %  ?hCG, quantitative, pregnancy     Status: Abnormal  ? Collection Time: 11/20/21 11:57 AM  ?Result Value Ref Range  ? hCG, Beta Chain, Quant, S 71,621 (H) <5 mIU/mL  ?Rh IG workup (includes ABO/Rh)     Status: None  ? Collection Time: 11/20/21 11:57 AM  ?Result Value Ref Range  ? Gestational Age(Wks) 7   ? ABO/RH(D) B NEG   ? Antibody Screen NEG   ? Unit Number [id number]   ? Blood Component Type RHIG   ? Unit division 00   ? Status of Unit REL FROM [facility]   ? Transfusion Status    ?  OK TO TRANSFUSE ?Performed at [facility], [address], [place], [place] [address] ?  ? ? ?Imaging ?US OB LESS THAN 14 WEEKS WITH OB TRANSVAGINAL ? ?Result Date: 11/20/2021 ?CLINICAL DATA:  Nausea and cramping. Quantitative beta hCG pending at time dictation EXAM: OBSTETRIC <14 WK ULTRASOUND TECHNIQUE: Transabdominal ultrasound was performed for evaluation of the gestation as well as the maternal uterus and adnexal regions. COMPARISON:  None. FINDINGS: Intrauterine gestational sac: Single Yolk sac:  Visualized. Embryo:  Visualized. Cardiac Activity: Visualized. Heart Rate: 159 bpm CRL:   1.07 mm   7 w 1 d  [place] EDC: July 08, 2022 Subchorionic hemorrhage:  None visualized. Maternal uterus/adnexae: 3.1 cm right ovarian cyst. Left ovary is unremarkable. IMPRESSION: Early single viable intrauterine pregnancy. Electronically Signed   By: Maudry Mayhew M.D.   On: 11/20/2021 12:30   ? ?MAU Course   ?Procedures ?Lab Orders    ?     Wet prep, genital    ?     Culture, OB Urine    ?     Urinalysis, Routine w reflex microscopic Urine, Clean Catch    ?     CBC    ?     hCG, quantitative, pregnancy    ?     Pregnancy, urine POC    ?Meds ordered this encounter  ?Medications  ? Doxylamine-Pyridoxine (DICLEGIS) 10-10 MG TBEC  ?  Sig: Take 2 tablets by mouth at bedtime. If symptoms persist, add one tablet in the morning and one in the afternoon  ?  Dispense:  100 tablet  ?  Refill:  5  ?  Order Specific Question:   Supervising Provider  ?  Answer:   Levie Heritage [[id number]]  ? ?Imaging Orders    ?     US OB LESS THAN 14 WEEKS WITH OB TRANSVAGINAL    ? ? ?MDM ?Patient presents with abdominal cramping.  ?+UPT ?UA, wet prep, GC/chlamydia, CBC, ABO/Rh, quant hCG, and [place] today to rule out ectopic pregnancy which can be life threatening.  ? ?Ultrasound shows live IUP measuring consistently with dating by LMP.  ?U/a with large leuks. No urinary complaints. Will send urine culture.  ?Patient would like prescription for antiemetic ?Assessment and Plan  ? ?1. Abdominal pain during pregnancy in first trimester  ?-urine culture & GC/CT pendign ?-reviewed reasons to return to MAU  ?2. Normal IUP (intrauterine pregnancy) on prenatal ultrasound, first trimester   ?3. Nausea and vomiting during pregnancy prior to [redacted] weeks gestation  ?-rx diclegis. Also given instructions for OTC diclegis if insurance doesn't cover rx  ?4. [redacted] weeks gestation of pregnancy   ? ? ? ?Judeth Horn, NP ?11/20/21 ?7:21 PM ? ? ?

## 2021-11-20 NOTE — MAU Note (Addendum)
Theresa Lam is a 26 y.o. in MAU reporting: missed menstrual cycle, cramping  and nausea, denies vomiting.  Denies vaginal bleeding ?LMP: 09/30/2021 ?Onset of complaint: 2 days ago ?Pain score: 5 ? ?Lab orders placed from triage:   UPT & U/A ? ?VS: 98.6- 71-18-109/61 O2 Sats 99% ? ?

## 2021-11-20 NOTE — Discharge Instructions (Signed)
Nausea medication to take during pregnancy:  ?Unisom (doxylamine succinate 25 mg tablets) Take one tablet daily at bedtime. If symptoms are not adequately controlled, the dose can be increased to a maximum recommended dose of two tablets daily (1/2 tablet in the morning, 1/2 tablet mid-afternoon and one at bedtime). ?Vitamin B6 100mg  tablets. Take one tablet twice a day (up to 200 mg per day). ? ? ? ? ? ? Area Ob/Gyn Providers  ? ? ? ? ? ?   ?Center for [facility] at [facility]  [address] [place], [place]  [phone]  ?Center for [facility] at [facility]  [address], [place], [place]  [phone]  ?Center for [facility] at [facility] [address] [place], [place]  [phone]  ?Center for [facility] at [facility]  [address] [place], [place]  [phone]  ?Center for [phone] at [employer] [facility]  [address]First floor), [place], [place]  [phone]  ?Center for [facility] at [facility]  [address] [place], [place], [place]  [phone]  ?[facility]  [address], [place], [place]  [phone]  ?[facility]  [address] [place], [place], [place]  [phone]  ?[address] Ob/gyn  [address] [place], [place]  [phone]  ?[facility]  [address], [place], [place]  [phone]  ?[facility]  [address], [place], [place]  [phone]  ?[facility]   [address] [place], [place]  [phone]  ?[facility]  [address], [place], [place]   [phone]  ?[facility]  [address] [place], [place]  [phone]  ? ? ? ? ? ? ? ?

## 2021-11-21 LAB — GC/CHLAMYDIA PROBE AMP (~~LOC~~) NOT AT ARMC
Chlamydia: NEGATIVE
Comment: NEGATIVE
Comment: NORMAL
Neisseria Gonorrhea: NEGATIVE

## 2021-11-22 LAB — CULTURE, OB URINE: Culture: 20000 — AB

## 2022-04-03 ENCOUNTER — Ambulatory Visit (INDEPENDENT_AMBULATORY_CARE_PROVIDER_SITE_OTHER): Payer: Medicaid Other

## 2022-04-03 DIAGNOSIS — Z3201 Encounter for pregnancy test, result positive: Secondary | ICD-10-CM | POA: Diagnosis not present

## 2022-04-03 DIAGNOSIS — N912 Amenorrhea, unspecified: Secondary | ICD-10-CM | POA: Diagnosis not present

## 2022-04-03 DIAGNOSIS — Z3200 Encounter for pregnancy test, result unknown: Secondary | ICD-10-CM

## 2022-04-03 LAB — POCT URINE PREGNANCY: Preg Test, Ur: POSITIVE — AB

## 2022-04-03 NOTE — Progress Notes (Signed)
..  Theresa Lam presents today for UPT. She has no unusual complaints. LMP:01/28/22    OBJECTIVE: Appears well, in no apparent distress.  OB History     Gravida  3   Para  1   Term  1   Preterm      AB  1   Living  1      SAB      IAB      Ectopic      Multiple  0   Live Births  1          Home UPT Result:Positive In-Office UPT result:Positive I have reviewed the patient's medical, obstetrical, social, and family histories, and medications.   ASSESSMENT: Positive pregnancy test  PLAN Prenatal care to be completed at: [facility]

## 2022-04-05 ENCOUNTER — Ambulatory Visit (INDEPENDENT_AMBULATORY_CARE_PROVIDER_SITE_OTHER): Payer: Medicaid Other

## 2022-04-05 VITALS — BP 111/72 | HR 73 | Ht 68.0 in | Wt 164.5 lb

## 2022-04-05 DIAGNOSIS — Z3481 Encounter for supervision of other normal pregnancy, first trimester: Secondary | ICD-10-CM | POA: Diagnosis not present

## 2022-04-05 DIAGNOSIS — Z3480 Encounter for supervision of other normal pregnancy, unspecified trimester: Secondary | ICD-10-CM | POA: Insufficient documentation

## 2022-04-05 DIAGNOSIS — O3680X0 Pregnancy with inconclusive fetal viability, not applicable or unspecified: Secondary | ICD-10-CM

## 2022-04-05 DIAGNOSIS — Z3A09 9 weeks gestation of pregnancy: Secondary | ICD-10-CM

## 2022-04-05 MED ORDER — VITAFOL GUMMIES 3.33-0.333-34.8 MG PO CHEW: 3.0000 | CHEWABLE_TABLET | Freq: Every day | ORAL | 11 refills | Status: DC

## 2022-04-05 MED ORDER — GOJJI WEIGHT SCALE MISC: 1.0000 | 0 refills | Status: DC

## 2022-04-05 MED ORDER — BLOOD PRESSURE KIT DEVI: 1.0000 | 0 refills | Status: DC

## 2022-04-05 NOTE — Progress Notes (Signed)
New OB Intake  I connected with  Theresa Lam on 04/05/22 at  1:10 PM EDT by in person and verified that I am speaking with the correct person using two identifiers. Nurse is located at [facility] and pt is located at [place].  I discussed the limitations, risks, security and privacy concerns of performing an evaluation and management service by telephone and the availability of in person appointments. I also discussed with the patient that there may be a patient responsible charge related to this service. The patient expressed understanding and agreed to proceed.  I explained I am completing New OB Intake today. We discussed her EDD of 11/04/22 that is based on LMP of 01/28/22. Pt is G3/P1011. I reviewed her allergies, medications, Medical/Surgical/OB history, and appropriate screenings. I informed her of [facility] services. [facility] information placed in AVS. Based on history, this is a/an  pregnancy uncomplicated .   There are no problems to display for this patient.   Concerns addressed today  Delivery Plans Plans to deliver at [facility]. Patient given information for [facility] Healthy Baby website for more information about [facility]. Patient is not interested in water birth. Offered upcoming OB visit with CNM to discuss further.  MyChart/Babyscripts MyChart access verified. I explained pt will have some visits in office and some virtually. Babyscripts instructions given and order placed. Patient verifies receipt of registration text/e-mail. Account successfully created and app downloaded.  Blood Pressure Cuff/Weight Scale Blood pressure cuff ordered for patient to pick-up from [employer]. Explained after first prenatal appt pt will check weekly and document in Babyscripts. Patient does not  have weight scale. Weight scale ordered for patient to pick up from [employer].   Anatomy US Explained first scheduled [place] will be around 19 weeks. Dating and viability scan  performed today  Labs Discussed Theresa Lam genetic screening with patient. Would like both Panorama and Horizon drawn at new OB visit. Routine prenatal labs needed.  Covid Vaccine Patient has not covid vaccine.   Is patient a CenteringPregnancy candidate?  Interested Declined due to  Not a candidate due to  Centering Patient" indicated on sticky note  Social Determinants of Health Food Insecurity: Patient denies food insecurity. WIC Referral: Patient is interested in referral to [facility].  Transportation: Patient denies transportation needs. Childcare: Discussed no children allowed at ultrasound appointments. Offered childcare services; patient declines childcare services at this time.  First visit review I reviewed new OB appt with pt. I explained she will have a provider visit that includes prenatal labs, pap smear, std testing, and discuss plan of care with provider. Explained pt will be seen by Ndulue Chiagoziem at first visit; encounter routed to appropriate provider. Explained that patient will be seen by pregnancy navigator following visit with provider.   Theresa Capri, RN 04/05/2022  1:27 PM

## 2022-04-12 ENCOUNTER — Encounter: Payer: Self-pay | Admitting: *Deleted

## 2022-04-12 ENCOUNTER — Telehealth: Payer: Self-pay | Admitting: *Deleted

## 2022-04-12 NOTE — Telephone Encounter (Signed)
I was asked to Call Theresa Lam to give her more information about CenteringPregnancy. I called Theresa Lam and was unable to leave a message but heard message person unavailable. I will send a Mychart message. [name]

## 2022-04-18 ENCOUNTER — Encounter: Payer: Self-pay | Admitting: *Deleted

## 2022-04-23 ENCOUNTER — Encounter: Payer: Self-pay | Admitting: *Deleted

## 2022-05-10 ENCOUNTER — Encounter: Payer: Self-pay | Admitting: *Deleted

## 2022-05-11 ENCOUNTER — Ambulatory Visit (INDEPENDENT_AMBULATORY_CARE_PROVIDER_SITE_OTHER): Payer: Medicaid Other | Admitting: Family Medicine

## 2022-05-11 ENCOUNTER — Encounter: Payer: Self-pay | Admitting: Family Medicine

## 2022-05-11 ENCOUNTER — Other Ambulatory Visit (HOSPITAL_COMMUNITY)
Admission: RE | Admit: 2022-05-11 | Discharge: 2022-05-11 | Disposition: A | Discharge status: Home / Self Care | Payer: Medicaid Other | Source: Ambulatory Visit | Attending: Family Medicine | Admitting: Family Medicine

## 2022-05-11 VITALS — BP 110/70 | HR 82 | Wt 170.0 lb

## 2022-05-11 DIAGNOSIS — Z3480 Encounter for supervision of other normal pregnancy, unspecified trimester: Secondary | ICD-10-CM | POA: Diagnosis present

## 2022-05-11 DIAGNOSIS — Z3A14 14 weeks gestation of pregnancy: Secondary | ICD-10-CM

## 2022-05-11 DIAGNOSIS — Z3482 Encounter for supervision of other normal pregnancy, second trimester: Secondary | ICD-10-CM

## 2022-05-11 DIAGNOSIS — D573 Sickle-cell trait: Secondary | ICD-10-CM

## 2022-05-11 DIAGNOSIS — O99012 Anemia complicating pregnancy, second trimester: Secondary | ICD-10-CM

## 2022-05-11 DIAGNOSIS — O99019 Anemia complicating pregnancy, unspecified trimester: Secondary | ICD-10-CM

## 2022-05-11 DIAGNOSIS — Z8619 Personal history of other infectious and parasitic diseases: Secondary | ICD-10-CM

## 2022-05-11 NOTE — Progress Notes (Signed)
History:   Theresa Lam is a 26 y.o. G3P1011 at [redacted]w[redacted]d by LMP, consistent with 9 wk [place], being seen today for her first obstetrical visit.  Her obstetrical history is significant for  none . Patient does intend to breast feed. Pregnancy history fully reviewed.  Patient reports backache. Intermittent low back pain. No other symptoms. No cramping, nausea or vomiting, no vaginal bleeding. Generally healthy, with no signficant medical history.  She had her first baby 6 years ago., pregnancy and delivery were uncomplicated. Daughter was full term.  Was previously smoking cigarette and marijuana. Quit earlier this year after an unexpected pregnancy that was terminated.   HISTORY: OB History  Gravida Para Term Preterm AB Living  $Remov'3 1 1 'EMiqQB$ 0 1 1  SAB IAB Ectopic Multiple Live Births  0 0 0 0 1    # Outcome Date GA Lbr Len/2nd Weight Sex Delivery Anes PTL Lv  3 Current           2 AB 11/2021 [redacted]w[redacted]d    TAB     1 Term 03/31/16 [redacted]w[redacted]d 00:19 / 00:54 7 lb 12.3 oz (3.525 kg) F Vag-Spont Local  LIV     Birth Comments: Hgb, Normal, FA Newborn Screen Barcode: [id number] Date Collected: 04/01/2016     Name: [facility]     Apgar1: 8  Apgar5: 9    Last pap smear was done - no previous pap smear. Pap smear collected today.   Past Medical History:  Diagnosis Date   HSV-2 infection    Sickle cell trait ([place])    Past Surgical History:  Procedure Laterality Date   NO PAST SURGERIES     Family History  Problem Relation Age of Onset   Healthy Mother    Cancer Father    Hypertension Maternal Grandmother    Diabetes Paternal Grandfather    Social History   Tobacco Use   Smoking status: Former    Types: Cigars    Quit date: 2023    Years since quitting: 0.6   Smokeless tobacco: Never  Vaping Use   Vaping Use: Never used  Substance Use Topics   Alcohol use: Not Currently    Comment: last 10/27/21   Drug use: Not Currently    Types: Marijuana    Comment: daily, not since  confirmed pregnancy   No Known Allergies Current Outpatient Medications on File Prior to Visit  Medication Sig Dispense Refill   acetaminophen (TYLENOL) 500 MG tablet Take 1,000 mg by mouth every 6 (six) hours as needed for mild pain.     Blood Pressure Monitoring (BLOOD PRESSURE KIT) DEVI 1 kit by Does not apply route once a week. 1 each 0   Misc. Devices (GOJJI WEIGHT SCALE) MISC 1 Device by Does not apply route every 30 (thirty) days. 1 each 0   Prenatal Vit-Fe Phos-FA-Omega (VITAFOL GUMMIES) 3.33-0.333-34.8 MG CHEW Chew 3 tablets by mouth daily. 90 tablet 11   No current facility-administered medications on file prior to visit.    Review of Systems Pertinent items noted in HPI and remainder of comprehensive ROS otherwise negative.  Indications for ASA therapy (per UpToDate) One of the following: Previous pregnancy with preeclampsia, especially early onset and with an adverse outcome No Multifetal gestation No Chronic hypertension No Type 1 or 2 diabetes mellitus No Chronic kidney disease No Autoimmune disease (antiphospholipid syndrome, systemic lupus erythematosus) No Two or more of the following: Nulliparity No Obesity (body mass index >30 kg/m2) No Family history of  preeclampsia in mother or sister No Age ?35 years No Sociodemographic characteristics (African American race, low socioeconomic level) Yes Personal risk factors (eg, previous pregnancy with low birth weight or small for gestational age infant, previous adverse pregnancy outcome [eg, stillbirth], interval >10 years between pregnancies) No  Indications for early GDM screening (FBS, A1C, Random CBG, GTT) First-degree relative with diabetes Yes - 2 siblings with GDM BMI >30kg/m2 No Age > 35 No Previous birth of an infant weighing ?4000 g No Gestational diabetes mellitus in a previous pregnancy No Glycated hemoglobin ?5.7 percent (39 mmol/mol), impaired glucose tolerance, or impaired fasting glucose on previous  testing No High-risk race/ethnicity (eg, African American, Latino, Native American, Asian American, Pacific Islander) Yes Previous stillbirth of unknown cause No Maternal birthweight > 9 lbs No History of cardiovascular disease No Hypertension or on therapy for hypertension No High-density lipoprotein cholesterol level <35 mg/dL (0.90 mmol/L) and/or a triglyceride level >250 mg/dL (2.82 mmol/L) No Polycystic ovary syndrome No Physical inactivity No Other clinical condition associated with insulin resistance (eg, severe obesity, acanthosis nigricans) No Current use of glucocorticoids No   Physical Exam:   Vitals:   05/11/22 1041  BP: 110/70  Pulse: 82  Weight: 170 lb (77.1 kg)   Fetal Heart Rate (bpm): 157  Uterine size: Fundal Height: 14 cm   General: well-developed, well-nourished female in no acute distress  Breasts:  normal appearance, no masses or tenderness bilaterally, exam done in the presence of a chaperone.   Skin: normal coloration and turgor, no rashes  Neurologic: oriented, normal, negative, normal mood  Extremities: normal strength, tone, and muscle mass, ROM of all joints is normal  HEENT PERRLA, extraocular movement intact and sclera clear, anicteric  Neck supple and no masses  Cardiovascular: regular rate and rhythm  Respiratory:  no respiratory distress, normal breath sounds  Abdomen: soft, non-tender; bowel sounds normal; no masses,  no organomegaly  Pelvic: normal external genitalia, no lesions, normal vaginal mucosa, normal vaginal discharge, normal cervix, pap smear done. Exam done in the presence of a chaperone.     Assessment:    Pregnancy: G3P1011 Patient Active Problem List   Diagnosis Date Noted   History of herpes genitalis 05/11/2022   Supervision of other normal pregnancy, antepartum 04/05/2022     Plan:    1. Supervision of other normal pregnancy, antepartum Doing well. No concerns today. Measuring well for dates. FHR normal. Initial labs  drawn. Hgb A1c, given family history of GDM X2. Pap collected. Follow up in 4 weeks.   - CBC/D/Plt+RPR+Rh+ABO+RubIgG... - Cytology - PAP( [place]) - Culture, OB Urine - Cervicovaginal ancillary only( [place]) - HgB A1c - [place] MFM OB COMP + 14 WK; Future - PANORAMA PRENATAL TEST FULL PANEL - [facility]OR[facility] CUSTOM  2. [redacted] weeks gestation of pregnancy Anatomy scan ordered  3. History of herpes genitalis. No current lesions. Discussed need for valacyclovir for suppression of lesion from [redacted] weeks gestation.  4. Sickle cell Trait: Does not know genotype of FOB. Plans to ask.  Initial labs drawn. Continue prenatal vitamins. Problem list reviewed and updated. Genetic Screening discussed, Panorama and Horizon: requested. Ultrasound discussed; fetal anatomic survey: planned and ordered.. Anticipatory guidance about prenatal visits given including labs, ultrasounds, and testing.  Weight gain recommendations per IOM guidelines reviewed: underweight/BMI 18.5 or less > 28 - 40 lbs; normal weight/BMI 18.5 - 24.9 > 25 - 35 lbs; overweight/BMI 25 - 29.9 > 15 - 25 lbs; obese/BMI  30 or more > 11 -  20 lbs. Discussed usage of the Babyscripts app for more information about pregnancy, and to track blood pressures. Also discussed usage of virtual visits as additional source of managing and completing prenatal visits.  Patient was encouraged to use MyChart to review results, send requests, and have questions addressed.   The nature of [facility] Healthcare/Faculty Practice with multiple MDs and Advanced Practice Providers was explained to patient; also emphasized that residents, students are part of our team. Routine obstetric precautions reviewed. Encouraged to seek out care at our office or emergency room [facility] MAU preferred) for urgent and/or emergent concerns. Return in about 4 weeks (around 06/08/2022) for lob.     Liliane Channel MD MPH OB Fellow, [facility] for  [employer], [place]

## 2022-05-11 NOTE — Patient Instructions (Signed)
Schedule your anatomy scan

## 2022-05-12 LAB — HEMOGLOBIN A1C
Est. average glucose Bld gHb Est-mCnc: 97 mg/dL
Hgb A1c MFr Bld: 5 % (ref 4.8–5.6)

## 2022-05-12 LAB — CBC/D/PLT+RPR+RH+ABO+RUBIGG...
Antibody Screen: NEGATIVE
Basophils Absolute: 0 10*3/uL (ref 0.0–0.2)
Basos: 1 %
EOS (ABSOLUTE): 0.1 10*3/uL (ref 0.0–0.4)
Eos: 1 %
HCV Ab: NONREACTIVE
HIV Screen 4th Generation wRfx: NONREACTIVE
Hematocrit: 31.5 % — ABNORMAL LOW (ref 34.0–46.6)
Hemoglobin: 10.7 g/dL — ABNORMAL LOW (ref 11.1–15.9)
Hepatitis B Surface Ag: NEGATIVE
Immature Grans (Abs): 0 10*3/uL (ref 0.0–0.1)
Immature Granulocytes: 1 %
Lymphocytes Absolute: 1.1 10*3/uL (ref 0.7–3.1)
Lymphs: 21 %
MCH: 28.8 pg (ref 26.6–33.0)
MCHC: 34 g/dL (ref 31.5–35.7)
MCV: 85 fL (ref 79–97)
Monocytes Absolute: 0.4 10*3/uL (ref 0.1–0.9)
Monocytes: 8 %
Neutrophils Absolute: 3.7 10*3/uL (ref 1.4–7.0)
Neutrophils: 68 %
Platelets: 176 10*3/uL (ref 150–450)
RBC: 3.72 x10E6/uL — ABNORMAL LOW (ref 3.77–5.28)
RDW: 13 % (ref 11.7–15.4)
RPR Ser Ql: NONREACTIVE
Rh Factor: NEGATIVE
Rubella Antibodies, IGG: 3.56 index (ref >0.99)
WBC: 5.4 10*3/uL (ref 3.4–10.8)

## 2022-05-12 LAB — HCV INTERPRETATION

## 2022-05-14 ENCOUNTER — Other Ambulatory Visit: Payer: Self-pay | Admitting: Family Medicine

## 2022-05-14 DIAGNOSIS — B3731 Acute candidiasis of vulva and vagina: Secondary | ICD-10-CM

## 2022-05-14 DIAGNOSIS — O99019 Anemia complicating pregnancy, unspecified trimester: Secondary | ICD-10-CM

## 2022-05-14 DIAGNOSIS — A5901 Trichomonal vulvovaginitis: Secondary | ICD-10-CM

## 2022-05-14 LAB — URINE CULTURE, OB REFLEX

## 2022-05-14 LAB — CERVICOVAGINAL ANCILLARY ONLY
Bacterial Vaginitis (gardnerella): POSITIVE — AB
Candida Glabrata: NEGATIVE
Candida Vaginitis: POSITIVE — AB
Chlamydia: NEGATIVE
Comment: NEGATIVE
Comment: NEGATIVE
Comment: NEGATIVE
Comment: NEGATIVE
Comment: NEGATIVE
Comment: NORMAL
Neisseria Gonorrhea: NEGATIVE
Trichomonas: POSITIVE — AB

## 2022-05-14 LAB — CULTURE, OB URINE

## 2022-05-14 MED ORDER — METRONIDAZOLE 500 MG PO TABS: 500.0000 mg | ORAL_TABLET | Freq: Two times a day (BID) | ORAL | 0 refills | Status: AC

## 2022-05-14 MED ORDER — TERCONAZOLE 0.4 % VA CREA: 1.0000 | TOPICAL_CREAM | Freq: Every day | VAGINAL | 0 refills | Status: AC

## 2022-05-14 MED ORDER — FERROUS SULFATE 325 (65 FE) MG PO TABS: 325.0000 mg | ORAL_TABLET | ORAL | 3 refills | Status: DC

## 2022-05-14 NOTE — Addendum Note (Signed)
Addended by: Alfredia Ferguson on: 05/14/2022 05:55 PM   Modules accepted: Orders

## 2022-05-14 NOTE — Progress Notes (Signed)
Rx for metronidazole and terconazole sent for trichomonas and candida vaginitis infections. will need TOC ~28 weeks

## 2022-05-16 ENCOUNTER — Encounter: Payer: Medicaid Other | Admitting: Family Medicine

## 2022-05-16 LAB — CYTOLOGY - PAP
Comment: NEGATIVE
Diagnosis: NEGATIVE
High risk HPV: NEGATIVE

## 2022-05-16 NOTE — Progress Notes (Deleted)
     PRENATAL VISIT NOTE  Subjective:  Theresa Lam is a 26 y.o. G3P1011 at [redacted]w[redacted]d being seen today for ongoing prenatal care.  She is currently monitored for the following issues for this low-risk pregnancy and has Supervision of other normal pregnancy, antepartum; History of herpes genitalis; Sickle cell trait in mother affecting pregnancy (HCC); and Trichomonas vaginalis (TV) infection on their problem list.  Patient reports {sx:14538}.   .  .   . Denies leaking of fluid.   The following portions of the patient's history were reviewed and updated as appropriate: allergies, current medications, past family history, past medical history, past social history, past surgical history and problem list.   Objective:  There were no vitals filed for this visit.  Fetal Status:           General:  Alert, oriented and cooperative. Patient is in no acute distress.  Skin: Skin is warm and dry. No rash noted.   Cardiovascular: Normal heart rate noted  Respiratory: Normal respiratory effort, no problems with respiration noted  Abdomen: Soft, gravid, appropriate for gestational age.        Pelvic: {Blank single:19197::"Cervical exam performed in the presence of a chaperone","Cervical exam deferred"}        Extremities: Normal range of motion.     Mental Status: Normal mood and affect. Normal behavior. Normal judgment and thought content.   Assessment and Plan:  Pregnancy: G3P1011 at [redacted]w[redacted]d 1. History of herpes genitalis Needs ppx at 36 weeks  2. Sickle cell trait in mother affecting pregnancy (HCC)  3. Supervision of other normal pregnancy, antepartum  Centering Pregnancy, Session#2: Reviewed rules for self-governance with group. Direct group to [employer].   Facilitated discussion today:  Nutrition, Back pain, Genetic screening options with AFP/Quad.   Mindfulness activity completed as well as deep breathing with 1 minutes of tension and 1 minute of relaxation for childbirth  preparation.  Also participated in mindful eating activity Activity-demonstrated exercises to alleviate back pain.  Fundal height and FHR appropriate today unless noted otherwise in plan. Patient to continue group care.   4. Trichomonas vaginalis (TV) infection Treated, TOC at 28 weeks  Preterm labor symptoms and general obstetric precautions including but not limited to vaginal bleeding, contractions, leaking of fluid and fetal movement were reviewed in detail with the patient. Please refer to After Visit Summary for other counseling recommendations.   No follow-ups on file.  Future Appointments  Date Time Provider Department Center  05/16/2022  9:00 AM Federico Flake, MD [facility]  06/08/2022 11:15 AM Constant, Gigi Gin, MD [facility] None  06/11/2022  9:45 AM [facility] US6 [facility]  06/13/2022  9:00 AM CENTERING PROVIDER [facility]  07/11/2022  9:00 AM CENTERING PROVIDER [facility]  07/25/2022  9:00 AM CENTERING PROVIDER [facility]  08/08/2022  9:00 AM CENTERING PROVIDER [facility]  08/22/2022  9:00 AM CENTERING PROVIDER [facility]  09/05/2022  9:00 AM CENTERING PROVIDER [facility]  09/19/2022  9:00 AM CENTERING PROVIDER [facility]  10/03/2022  9:00 AM CENTERING PROVIDER [facility]    Federico Flake, MD

## 2022-05-17 ENCOUNTER — Encounter: Payer: Self-pay | Admitting: Family Medicine

## 2022-05-28 ENCOUNTER — Encounter: Payer: Self-pay | Admitting: *Deleted

## 2022-05-30 ENCOUNTER — Encounter: Payer: Self-pay | Admitting: *Deleted

## 2022-06-06 LAB — HORIZON CUSTOM: REPORT SUMMARY: POSITIVE — AB

## 2022-06-07 ENCOUNTER — Encounter: Payer: Self-pay | Admitting: *Deleted

## 2022-06-08 ENCOUNTER — Encounter: Payer: Self-pay | Admitting: Obstetrics and Gynecology

## 2022-06-08 ENCOUNTER — Other Ambulatory Visit (HOSPITAL_COMMUNITY)
Admission: RE | Admit: 2022-06-08 | Discharge: 2022-06-08 | Disposition: A | Discharge status: Home / Self Care | Payer: Medicaid Other | Source: Ambulatory Visit | Attending: Obstetrics and Gynecology | Admitting: Obstetrics and Gynecology

## 2022-06-08 ENCOUNTER — Ambulatory Visit (INDEPENDENT_AMBULATORY_CARE_PROVIDER_SITE_OTHER): Payer: Medicaid Other | Admitting: Obstetrics and Gynecology

## 2022-06-08 VITALS — BP 110/68 | HR 73 | Wt 182.0 lb

## 2022-06-08 DIAGNOSIS — Z3480 Encounter for supervision of other normal pregnancy, unspecified trimester: Secondary | ICD-10-CM | POA: Diagnosis present

## 2022-06-08 DIAGNOSIS — Z3482 Encounter for supervision of other normal pregnancy, second trimester: Secondary | ICD-10-CM

## 2022-06-08 DIAGNOSIS — Z3A18 18 weeks gestation of pregnancy: Secondary | ICD-10-CM

## 2022-06-08 DIAGNOSIS — Z23 Encounter for immunization: Secondary | ICD-10-CM

## 2022-06-08 DIAGNOSIS — A5901 Trichomonal vulvovaginitis: Secondary | ICD-10-CM

## 2022-06-08 DIAGNOSIS — Z8619 Personal history of other infectious and parasitic diseases: Secondary | ICD-10-CM

## 2022-06-08 NOTE — Progress Notes (Signed)
   PRENATAL VISIT NOTE  Subjective:  Theresa Lam is a 26 y.o. G3P1011 at [redacted]w[redacted]d being seen today for ongoing prenatal care.  She is currently monitored for the following issues for this low-risk pregnancy and has Supervision of other normal pregnancy, antepartum; History of herpes genitalis; Sickle cell trait in mother affecting pregnancy ([place]); and Trichomonas vaginalis (TV) infection on their problem list.  Patient reports no complaints.  Contractions: Not present. Vag. Bleeding: None.  Movement: Present. Denies leaking of fluid.   The following portions of the patient's history were reviewed and updated as appropriate: allergies, current medications, past family history, past medical history, past social history, past surgical history and problem list.   Objective:   Vitals:   06/08/22 1122  BP: 110/68  Pulse: 73  Weight: 182 lb (82.6 kg)    Fetal Status: Fetal Heart Rate (bpm): 148   Movement: Present     General:  Alert, oriented and cooperative. Patient is in no acute distress.  Skin: Skin is warm and dry. No rash noted.   Cardiovascular: Normal heart rate noted  Respiratory: Normal respiratory effort, no problems with respiration noted  Abdomen: Soft, gravid, appropriate for gestational age.  Pain/Pressure: Present     Pelvic: Cervical exam deferred        Extremities: Normal range of motion.  Edema: None  Mental Status: Normal mood and affect. Normal behavior. Normal judgment and thought content.   Assessment and Plan:  Pregnancy: G3P1011 at [redacted]w[redacted]d 1. Supervision of other normal pregnancy, antepartum Patient is doing well without complaints Hx trichomonas in August- test of cure today Anatomy ultrasound 9/25 Patient scheduled to join centering pregnancy group on 06/13/22. She missed her first appointment AFP today   2. History of herpes genitalis Prophylaxis in third trimester  Preterm labor symptoms and general obstetric precautions including but not limited  to vaginal bleeding, contractions, leaking of fluid and fetal movement were reviewed in detail with the patient. Please refer to After Visit Summary for other counseling recommendations.   Return in about 4 weeks (around 07/06/2022) for in person, ROB, Low risk.  Future Appointments  Date Time Provider [place]  06/11/2022  9:45 AM [facility] US6 [facility]  06/13/2022  9:00 AM CENTERING PROVIDER [facility]  07/11/2022  9:00 AM CENTERING PROVIDER [employer] [facility]  07/25/2022  9:00 AM CENTERING PROVIDER [facility]  08/08/2022  9:00 AM CENTERING PROVIDER [facility]  08/22/2022  9:00 AM CENTERING PROVIDER [facility]  09/05/2022  9:00 AM CENTERING PROVIDER [facility]  09/19/2022  9:00 AM CENTERING PROVIDER [facility]  10/03/2022  9:00 AM CENTERING PROVIDER [facility] [place]    Mora Bellman, MD

## 2022-06-08 NOTE — Progress Notes (Signed)
Pt presents for ROB and AFP. Flu vaccine given L Del without difficulty.

## 2022-06-10 LAB — CERVICOVAGINAL ANCILLARY ONLY
Chlamydia: NEGATIVE
Comment: NEGATIVE
Comment: NEGATIVE
Comment: NORMAL
Neisseria Gonorrhea: NEGATIVE
Trichomonas: POSITIVE — AB

## 2022-06-10 LAB — AFP, SERUM, OPEN SPINA BIFIDA
AFP MoM: 1.04
AFP Value: 47.4 ng/mL
Gest. Age on Collection Date: 18.5 weeks
Maternal Age At EDD: 25 yr
OSBR Risk 1 IN: 10000
Test Results:: NEGATIVE
Weight: 182 [lb_av]

## 2022-06-11 ENCOUNTER — Ambulatory Visit: Payer: Medicaid Other | Attending: Family Medicine

## 2022-06-11 ENCOUNTER — Other Ambulatory Visit: Payer: Self-pay | Admitting: *Deleted

## 2022-06-11 DIAGNOSIS — Z363 Encounter for antenatal screening for malformations: Secondary | ICD-10-CM | POA: Diagnosis not present

## 2022-06-11 DIAGNOSIS — O99012 Anemia complicating pregnancy, second trimester: Secondary | ICD-10-CM | POA: Diagnosis not present

## 2022-06-11 DIAGNOSIS — Z3A19 19 weeks gestation of pregnancy: Secondary | ICD-10-CM | POA: Insufficient documentation

## 2022-06-11 DIAGNOSIS — D573 Sickle-cell trait: Secondary | ICD-10-CM

## 2022-06-11 DIAGNOSIS — D563 Thalassemia minor: Secondary | ICD-10-CM

## 2022-06-11 DIAGNOSIS — O321XX0 Maternal care for breech presentation, not applicable or unspecified: Secondary | ICD-10-CM | POA: Diagnosis not present

## 2022-06-11 DIAGNOSIS — Z3480 Encounter for supervision of other normal pregnancy, unspecified trimester: Secondary | ICD-10-CM | POA: Diagnosis present

## 2022-06-11 MED ORDER — METRONIDAZOLE 500 MG PO TABS: 500.0000 mg | ORAL_TABLET | Freq: Two times a day (BID) | ORAL | 0 refills | Status: DC

## 2022-06-11 NOTE — Addendum Note (Signed)
Addended by: Mora Bellman on: 06/11/2022 08:14 AM   Modules accepted: Orders

## 2022-06-12 ENCOUNTER — Telehealth: Payer: Self-pay | Admitting: Emergency Medicine

## 2022-06-12 NOTE — Telephone Encounter (Signed)
Attempted TC to patient. Grandmother answered and stated she would have patient return call.

## 2022-06-13 ENCOUNTER — Ambulatory Visit (INDEPENDENT_AMBULATORY_CARE_PROVIDER_SITE_OTHER): Payer: Medicaid Other | Admitting: Advanced Practice Midwife

## 2022-06-13 VITALS — BP 104/66 | HR 76 | Wt 182.4 lb

## 2022-06-13 DIAGNOSIS — A5901 Trichomonal vulvovaginitis: Secondary | ICD-10-CM

## 2022-06-13 DIAGNOSIS — O99012 Anemia complicating pregnancy, second trimester: Secondary | ICD-10-CM

## 2022-06-13 DIAGNOSIS — Z8619 Personal history of other infectious and parasitic diseases: Secondary | ICD-10-CM

## 2022-06-13 DIAGNOSIS — Z3480 Encounter for supervision of other normal pregnancy, unspecified trimester: Secondary | ICD-10-CM

## 2022-06-13 DIAGNOSIS — Z3A19 19 weeks gestation of pregnancy: Secondary | ICD-10-CM

## 2022-06-13 DIAGNOSIS — D573 Sickle-cell trait: Secondary | ICD-10-CM

## 2022-06-13 DIAGNOSIS — Z3482 Encounter for supervision of other normal pregnancy, second trimester: Secondary | ICD-10-CM

## 2022-06-13 LAB — PANORAMA PRENATAL TEST FULL PANEL:PANORAMA TEST PLUS 5 ADDITIONAL MICRODELETIONS: FETAL FRACTION: 8

## 2022-06-14 ENCOUNTER — Telehealth: Payer: Self-pay

## 2022-06-14 NOTE — Telephone Encounter (Signed)
Third attempt to contact pt about results and rx, "call cannot be completed at this time"

## 2022-06-14 NOTE — Progress Notes (Signed)
   PRENATAL VISIT NOTE  Subjective:  Theresa Lam is a 26 y.o. G3P1011 at [redacted]w[redacted]d being seen today for ongoing prenatal care.  She is currently monitored for the following issues for this low-risk pregnancy and has Supervision of other normal pregnancy, antepartum; History of herpes genitalis; Sickle cell trait in mother affecting pregnancy ([place]); and Trichomonas vaginalis (TV) infection on their problem list. TOC 9/22 was positive. Tx re-sent.   Patient reports no complaints.  Contractions: Not present. Vag. Bleeding: None.  Movement: Present. Denies leaking of fluid.   The following portions of the patient's history were reviewed and updated as appropriate: allergies, current medications, past family history, past medical history, past social history, past surgical history and problem list.   Objective:   Vitals:   06/13/22 1348  BP: 104/66  Pulse: 76  Weight: 182 lb 6.4 oz (82.7 kg)    Fetal Status: Fetal Heart Rate (bpm): 152 Fundal Height: 19 cm Movement: Present     General:  Alert, oriented and cooperative. Patient is in no acute distress.  Skin: Skin is warm and dry. No rash noted.   Cardiovascular: Normal heart rate noted  Respiratory: Normal respiratory effort, no problems with respiration noted  Abdomen: Soft, gravid, appropriate for gestational age.  Pain/Pressure: Absent     Pelvic: Cervical exam deferred        Extremities: Normal range of motion.  Edema: None  Mental Status: Normal mood and affect. Normal behavior. Normal judgment and thought content.   Assessment and Plan:  Pregnancy: G3P1011 at [redacted]w[redacted]d 1. Supervision of other normal pregnancy, antepartum - Nml ut incomplete anatomy. F/U 10/23  2. History of herpes genitalis - Valtrex 36 weeks  3. Sickle cell trait in mother affecting pregnancy [facility]) - Partner test kit given  4. Trichomonas vaginalis (TV) infection - New Rx given. Emphasized importance of partner Tx and Abstaining x 1 week after both are  treated.   5. [redacted] weeks gestation of pregnancy      Centering Pregnancy, Session 3/5 (Newly combined): Reviewed resources in [employer].    Facilitated discussion today:  - "The Family I Want to Have" traditions/parenting practices to continue and discontinue - Reach Out and Read: Pediatric literacy, reading as a means for bonding with baby - Parenting role discussion - Preterm labor vs normal discomforts of pregnancy - Flu vaccine recommended - TDaP vaccine recommended   Fundal height and FHR appropriate today unless noted otherwise in plan. Patient to continue group care.    Preterm labor symptoms and general obstetric precautions including but not limited to vaginal bleeding, contractions, leaking of fluid and fetal movement were reviewed in detail with the patient. Please refer to After Visit Summary for other counseling recommendations.   Return in about 4 weeks (around 07/11/2022) for as scheduled.  Future Appointments  Date Time Provider [place]  07/09/2022  3:15 PM [facility] NURSE [facility]  07/09/2022  3:30 PM [facility] US3 [facility]  07/11/2022  9:00 AM CENTERING PROVIDER [facility]  07/25/2022  9:00 AM CENTERING PROVIDER [facility]  08/08/2022  9:00 AM CENTERING PROVIDER [facility]  08/22/2022  9:00 AM CENTERING PROVIDER [facility]  09/05/2022  9:00 AM CENTERING PROVIDER [facility]  09/19/2022  9:00 AM CENTERING PROVIDER [facility]  10/03/2022  9:00 AM CENTERING PROVIDER [facility] [place], CNM

## 2022-06-14 NOTE — Patient Instructions (Signed)
AREA PEDIATRIC/FAMILY PRACTICE PHYSICIANS  Central/[place]outheast [place] ([address]) [facility] Chambliss, MD; Eniola, MD; Hale, MD; Hensel, MD; McDiarmid, MD; McIntyer, MD; Neal, MD; Walden, MD [address] [place], [place] [address] [phone] Mon-Fri 8:30-12:30, 1:30-5:00 Providers come to see babies at [facility] Accepting Medicaid [facility] providers who accept newborns: Koirala, MD; Morrow, MD; Wolters, MD [address], [place], [place] [address] [phone] Mon-Fri 8:00-5:30 Babies seen by providers at [facility] Does NOT accept Medicaid Please call early in hospitalization for appointment (limited availability)  [facility] Mulberry, MD [address] [place], [place] [address] [phone] Mon, Tue, Thur, Fri 8:30-5:00, Wed 10:00-7:00 (closed 1-2pm) Babies seen by [facility] providers Accepting Medicaid Rubin - Pediatrician Rubin, MD [address], [place], [place] [address] [phone] Mon-Fri 8:30-5:00, Sat 8:30-12:00 Provider comes to see babies at [facility] Accepting Medicaid Must have been referred from current patients or contacted office prior to delivery [facility] ([facility]) Brown, MD; Chandler, MD; Ettefagh, MD; Grant, MD; Lester, MD; McCormick, MD; McQueen, MD; Prose, MD; Simha, MD; Stanley, MD; Stryffeler, NP; Tebben, NP [address], [place], [place] [address] [phone] Mon, Tue, Thur, Fri 8:30-5:30, Wed 9:30-5:30, Sat 8:30-12:30 Babies seen by [facility] providers Accepting Medicaid Only accepting infants of first-time parents or siblings of current patients Hospital discharge coordinator will make follow-up appointment Jack Amos [address], [place], [place]  [address] [phone]   Fax - [phone] [facility] [address], [place], [place]  [address] Phone - [phone]   Fax - [phone] Shilpa Gosrani [address], [place], [place]  [address] [phone]  [place]ortheast [place] ([address]) [facility] Bates, MD; Brassfield, MD; Cooper, Cox, MD; MD; Davis, MD; Dovico, MD; Ettefaugh, MD; Little, MD; Lowe, MD; Keiffer, MD; Melvin, MD; Sumner, MD; Williams, MD [address], [place], [place] [address] [phone] Mon-Fri 8:30-5:00 (extended evenings Mon-Thur as needed), Sat-Sun 10:00-1:00 Providers come to see babies at [facility] Accepting Medicaid for families of first-time babies and families with all children in the household age 3 and under. Must register with office prior to making appointment (M-F only). [facility] Henson, NP; Knapp, MD; Lalonde, MD; Tysinger, PA [address] [place], [place] [address] [phone] Mon-Fri 8:00-5:00 Babies seen by providers at [facility] Does NOT accept Medicaid/Commercial Insurance Only [facility] ([facility])  Artis, MD; Barnes, MD; Bratton, MD; Coccaro, MD; Lockett Gardner, MD; Kramer, MD; Marshall, MD; Netherton, MD; Poleto, MD; Skinner, MD [address] [place], [place] [address] [phone] Mon-Fri 8:30-5:30, Sat (Oct.-Mar.) 9:00-1:00 Babies seen by providers at [facility] Accepting Medicaid  [place] ([address]) [facility] Reid, MD; Warner, MD [address], [place], [place] [address] [phone] Mon-Fri 8:30-5:00, Sat 8:30-12:00 Providers come to see babies at [facility] Does NOT accept Medicaid [facility] Becker, PA; Hagler, MD; Scifres, PA; Sun, MD; Swayne, MD [address], [place], [place] [address] [phone] Mon-Fri 8:00-5:00 Babies seen by providers at [facility] Does NOT accept Medicaid Only accepting babies of parents who  are patients Please call early in hospitalization for appointment (limited availability) [facility] Clark, MD; Frye, MD; Kelleher, MD; Mack, NP; Miller, MD; O'Keller, MD; Patterson, NP; Pudlo, MD; Puzio, MD; Thomas, MD; Tucker, MD; Twiselton, MD [address], [place], [place] [address] [phone] Mon-Fri 8:00-5:00, Sat 9:00-12:00 Providers come to see babies at [facility] Does NOT accept Medicaid  [place] ([address]) [facility] providers accepting new patients: Brake, NP; Wharton, PA [address], [place], [place] [address] [phone] Mon-Fri 8:00-5:00 Babies seen by providers at [facility] Does NOT accept Medicaid Only accepting babies of parents who are patients Please call early in hospitalization for appointment (limited availability) [facility] Gay, MD; Quinlan, MD [address] [place], [place] [address] [phone] (press 1 to schedule appointment) Mon-Fri 8:00-5:00 Providers come to see babies at [facility] Does NOT accept Medicaid [facility] Mazer, MD [address] [place], [place] [address] [phone] Mon-Fri 8:30-5:00 (lunch 12:30-1:00), extended hours by appointment only Wed 5:00-6:30 Babies seen by [facility] providers Accepting Medicaid [facility] Banks, MD; Jordan, MD; Koberlein, MD [address], [place], [place] [address] [phone] Mon-Fri 8:00-5:00 Babies seen by [facility] providers Does NOT accept Medicaid [facility] Parker, MD; Hunter, MD; Wallace, DO [address] [place], [place] [address] [phone] Mon-Fri 8:00-5:00 Babies seen by [facility] providers Does NOT accept Medicaid [facility] Brandon, PA; Brecken, PA; Christy, NP; Dees, MD; DeClaire, MD; DeWeese, MD; Hansen, NP; Mills, NP; Parrish, NP; Smoot, NP; Summer, MD; Vapne,  MD [address] [place], [place] [address] [phone] Mon-Fri 8:30-5:00, Sat 10:00-1:00 Providers come to see babies at [facility] Does NOT accept Medicaid Free prenatal information session Tuesdays at 4:45pm [facility] Bouska, MD; Gordon, PA; Jeffery, PA; Weber, PA [address] [place] [address] [phone] Mon-Fri 7:30-5:30 Babies seen by [facility] providers [facility] [address], [place], [place]  [address] [phone]   Fax - [phone]  [place] ([address] & [address]) [facility] [place] [address] [place], [place] [address] [phone] Mon-Thur 8:00-6:00 Providers come to see babies at [facility] Accepting Medicaid [facility] Anderson, NP; Badger, MD; Beal, PA; Spencer, PA [address] [place], [place] [address] [phone] Mon-Thur 7:30-7:30, Fri 7:30-4:30 Babies seen by [facility] providers Accepting Medicaid [facility] Agbuya, MD; Klett, NP; Romgoolam, MD [address], [place], [place] [address] [phone] Mon-Fri 8:30-5:00, Sat 8:30-12:00 Providers come to see babies at [facility] Accepting Medicaid Must have "Meet & Greet" appointment at office prior to delivery [facility] ([facility] McCord, MD; Wallace, MD; Wood, MD [address], [place], [place] [address] [phone] Mon-Wed 8:00-6:00, Thur-Fri 8:00-5:00, Sat 9:00-12:00 Providers come to see babies at [facility] Does NOT accept Medicaid Only accepting siblings of current patients [facility]  [address], [place], [place]  [address] [phone]   Fax - [phone] [facility] [address], [place], [place]  [address] [phone]   Fax -  [phone]  [place] ([address] & [address]) [facility] Cirigliano, DO; Matthews, DO [address] , [place] [address] [phone] Mon-Fri 7:00-5:00 Babies seen by [facility] providers Does NOT accept Medicaid [facility] Briscoe, MD; Howley, PA; Moreira, PA [address], [place],  [address] [phone] Mon-Fri 8:00-5:00 Babies seen by [facility] providers Accepting Medicaid [facility] Boyd, MD; Church, PA; Jones, NP; Osborn, PA [address], ,  [address] [phone] Mon-Fri 8:00-5:00 Babies seen by providers at [facility] Accepting Medicaid  [place] ([address]) [facility] [place] Wendling, DO [address] [place], [place] [address] [phone] Mon-Fri 8:00-5:00 Babies seen by [facility] providers Does NOT accept Medicaid Limited availability, please call early in hospitalization to schedule follow-up [facility] Calderon, PA; Cummings, MD; Dillard, MD; Martin, PA; Olson, MD; VanDeven, PA [address], [place], [place] [address] [phone] Mon-Fri 8:30-5:00, Sat 9:00-12:00 Babies seen by providers at [facility] Accepting Medicaid Please register online then schedule online or call office [email or web address] Hunter, NP; Kumar, MD; Martin Rogers, PA [address], [place], [place] [address] [phone] Mon-Fri 8:00-5:00 Babies seen by providers at [facility] Accepting Medicaid [facility] ([facility]) Granjeno, MD; Kristi Fleenor, NP; West, MD [address], [place], [place] [address] [phone] Mon-Fri 8:00-5:30, Sat&Sun by appointment (phones open at  8:30) Babies seen by [facility] providers Accepting Medicaid Must be a first-time baby or sibling of current patient [facility] [place]  [address], [place], [place]  [address] [phone]   Fax - [phone]  [place] ([address]) [facility] Brown, PA; Cowen, PA; Rice, MD; Helton, PA; Spry, MD [address] [place], [place] [address] [phone] Mon-Thur 8:00-7:00, Fri 8:00-5:00, Sat 8:00-12:00, Sun 9:00-12:00 Babies seen by [facility] providers Accepting Medicaid [facility] [place] Coe-Goins, MD; Marshall, MD; Pierre-Louis, MD [address], [place], [place] [address] [phone] Mon-Thur 8:00-5:00 Babies seen by providers at [facility] Accepting Medicaid [facility] Bratton, MD; Coe-Goins, MD; Hayes, MD; Lewis, MD; List, MD; Lott, MD; Marshall, MD; Moran, MD; O'Neal, MD; Pierre-Louis, MD; Pitonzo, MD; Scholer, MD; Spangle, MD [address] [place], [place] [address] [phone] Mon-Fri 8:00-5:30, Sat (Oct.-Mar.) 9:00-1:00 Babies seen by providers at [facility] Accepting Medicaid Must fill out new patient packet, available online at [email or web address] [facility] ([facility]) Friddle, NP; Harris, NP; Kelly, NP; Logan, MD; Melvin, PA; Poth, MD; Ramadoss, MD; Stanton, NP [address], [place], [place] [address] [phone] Mon-Thur 8:00-5:30, Fri 8:00-5:00 Babies seen by providers at [facility] Accepting Medicaid  [place] ([address]) [facility] Dixon, PA; Bath, MD; Pickard, MD; Tapia, PA [address], [place], [place] [address] [phone] Mon-Fri 8:00-5:00 Babies seen by providers at [facility] Accepting Medicaid   [place] ([address]) [facility] [place] Masneri, DO; Meyers, MD; Nelson, PA [address], [place], [place] [address] [phone] Mon-Fri 8:00-5:00 Babies seen by providers at [facility] Does NOT accept Medicaid Limited appointment availability, please call early in hospitalization  [facility] [place] Kunedd, DO; McGowen, MD [address], [place], [place] [address] [phone] Mon-Fri 8:00-5:00 Babies seen by [facility] providers Does NOT accept Medicaid [facility] [place] Cameron, MD; MacDonald, MD; Michaels, PA; Nayak, MD [address] [place] [address], [place], [place] [address] [phone] Mon-Fri 8:00-5:00 After hours clinic ([address] [place],  [address]) [phone] Mon-Fri 5:00-8:00, Sat 12:00-6:00, Sun 10:00-4:00 Babies seen by [facility] providers Accepting Medicaid [facility] [place] [address], [place], [place]  [address] [phone]   Fax - [phone]  [place] ([address]) [facility] [place] [facility] Andy, MD [address], [place], [place] [address] [phone] Mon-Fri 8:00-5:00 Babies seen by [facility] providers Does NOT accept Medicaid [facility] [place] ([facility] [place]) Eksir, MD [address], [place], [place] [address] [phone] Mon-Thur 8:00-7:00, Fri 8:00-5:00, Sat 8:00-12:00 Babies seen by providers at [facility] Accepting Medicaid - but does not have vaccinations in office (must be received elsewhere) Limited availability, please call early in hospitalization  [place] ([address]) [facility]  Charlene Flemming, MD [address], [place] [address] [phone]  Fax [phone]  [facility]  Kimberly Newton, MD, Annamarie Streilein, PA, Carla Hampton, PA [address] [place], [place]  [address] [phone] [facility]  [address], [place], [place] [address] [phone] [address], [place], [place] [address] [phone] (West Office)  [facility] [address], [place], [place] [address] [phone] [facility] [address], [place], [place] [address] [phone] [facility] [address], [place], [place] [address] [phone] [facility] [address], [place], [place] [address] [phone] [facility] [address], [place], [place] [address] [phone] [facility] [address], [place], [place] [address] [phone] [facility]  [address], [place], [place] [address] [phone] [facility] [address], [place], [place] [address] [phone] [facility] [address], [place], [place] [address] [phone] [facility] [address], [place], [place] [address] [phone]  

## 2022-06-21 ENCOUNTER — Other Ambulatory Visit: Payer: Self-pay | Admitting: Obstetrics and Gynecology

## 2022-06-23 ENCOUNTER — Encounter: Payer: Self-pay | Admitting: Obstetrics and Gynecology

## 2022-06-26 ENCOUNTER — Other Ambulatory Visit (HOSPITAL_COMMUNITY)
Admission: RE | Admit: 2022-06-26 | Discharge: 2022-06-26 | Disposition: A | Discharge status: Home / Self Care | Payer: Medicaid Other | Source: Ambulatory Visit | Attending: Obstetrics & Gynecology | Admitting: Obstetrics & Gynecology

## 2022-06-26 ENCOUNTER — Ambulatory Visit (INDEPENDENT_AMBULATORY_CARE_PROVIDER_SITE_OTHER): Payer: Medicaid Other | Admitting: Emergency Medicine

## 2022-06-26 VITALS — BP 104/68 | HR 75 | Ht 68.0 in | Wt 183.6 lb

## 2022-06-26 DIAGNOSIS — Z3480 Encounter for supervision of other normal pregnancy, unspecified trimester: Secondary | ICD-10-CM | POA: Diagnosis present

## 2022-06-26 DIAGNOSIS — Z113 Encounter for screening for infections with a predominantly sexual mode of transmission: Secondary | ICD-10-CM | POA: Diagnosis not present

## 2022-06-26 DIAGNOSIS — O23592 Infection of other part of genital tract in pregnancy, second trimester: Secondary | ICD-10-CM

## 2022-06-26 DIAGNOSIS — N898 Other specified noninflammatory disorders of vagina: Secondary | ICD-10-CM

## 2022-06-26 NOTE — Progress Notes (Addendum)
Pt presents for TOC for trichomonas. States that she has completed all of the medication but was not consistent with taking it and skipped a few days.   Completed medication on 10/3.  Today, reports green discharge with vaginal itching. Denies odor, irritation or burning. Denies vaginal bleeding, pelvic pain.  Reports +FM.  Self swab performed and collected.

## 2022-06-27 LAB — CERVICOVAGINAL ANCILLARY ONLY
Bacterial Vaginitis (gardnerella): POSITIVE — AB
Candida Glabrata: NEGATIVE
Candida Vaginitis: POSITIVE — AB
Chlamydia: NEGATIVE
Comment: NEGATIVE
Comment: NEGATIVE
Comment: NEGATIVE
Comment: NEGATIVE
Comment: NEGATIVE
Comment: NORMAL
Neisseria Gonorrhea: NEGATIVE
Trichomonas: NEGATIVE

## 2022-06-27 MED ORDER — TERCONAZOLE 0.4 % VA CREA: 1.0000 | TOPICAL_CREAM | Freq: Every day | VAGINAL | 0 refills | Status: DC

## 2022-06-27 MED ORDER — METRONIDAZOLE 500 MG PO TABS: 500.0000 mg | ORAL_TABLET | Freq: Two times a day (BID) | ORAL | 0 refills | Status: AC

## 2022-06-27 NOTE — Addendum Note (Signed)
Addended by: Verita Schneiders A on: 06/27/2022 12:45 PM   Modules accepted: Orders

## 2022-07-05 ENCOUNTER — Encounter: Payer: Self-pay | Admitting: *Deleted

## 2022-07-09 ENCOUNTER — Ambulatory Visit: Payer: Medicaid Other

## 2022-07-11 ENCOUNTER — Encounter: Payer: Self-pay | Admitting: *Deleted

## 2022-07-11 ENCOUNTER — Ambulatory Visit (INDEPENDENT_AMBULATORY_CARE_PROVIDER_SITE_OTHER): Payer: Medicaid Other | Admitting: Family Medicine

## 2022-07-11 VITALS — BP 121/79 | HR 101 | Wt 191.6 lb

## 2022-07-11 DIAGNOSIS — O99019 Anemia complicating pregnancy, unspecified trimester: Secondary | ICD-10-CM

## 2022-07-11 DIAGNOSIS — D573 Sickle-cell trait: Secondary | ICD-10-CM

## 2022-07-11 DIAGNOSIS — Z3480 Encounter for supervision of other normal pregnancy, unspecified trimester: Secondary | ICD-10-CM

## 2022-07-11 DIAGNOSIS — Z8619 Personal history of other infectious and parasitic diseases: Secondary | ICD-10-CM

## 2022-07-11 DIAGNOSIS — Z3A23 23 weeks gestation of pregnancy: Secondary | ICD-10-CM

## 2022-07-11 DIAGNOSIS — A5901 Trichomonal vulvovaginitis: Secondary | ICD-10-CM

## 2022-07-11 NOTE — Progress Notes (Signed)
   PRENATAL VISIT NOTE  Subjective:  Theresa Lam is a 26 y.o. G3P1011 at [redacted]w[redacted]d being seen today for ongoing prenatal care.  She is currently monitored for the following issues for this low-risk pregnancy and has Supervision of other normal pregnancy, antepartum; History of herpes genitalis; Sickle cell trait in mother affecting pregnancy ([place]); and Trichomonas vaginalis (TV) infection on their problem list.  Patient reports no complaints.  Contractions: Not present. Vag. Bleeding: None.  Movement: Present. Denies leaking of fluid.   The following portions of the patient's history were reviewed and updated as appropriate: allergies, current medications, past family history, past medical history, past social history, past surgical history and problem list.   Objective:   Vitals:   07/11/22 1306  BP: 121/79  Pulse: (!) 101  Weight: 191 lb 9.6 oz (86.9 kg)    Fetal Status: Fetal Heart Rate (bpm): 150 Fundal Height: 23 cm Movement: Present     General:  Alert, oriented and cooperative. Patient is in no acute distress.  Skin: Skin is warm and dry. No rash noted.   Cardiovascular: Normal heart rate noted  Respiratory: Normal respiratory effort, no problems with respiration noted  Abdomen: Soft, gravid, appropriate for gestational age.  Pain/Pressure: Absent     Pelvic: Cervical exam deferred        Extremities: Normal range of motion.     Mental Status: Normal mood and affect. Normal behavior. Normal judgment and thought content.   Assessment and Plan:  Pregnancy: G3P1011 at [redacted]w[redacted]d 1. Supervision of other normal pregnancy, antepartum  Centering Pregnancy, Session#4: Reviewed resources in [employer].   Facilitated discussion today: - Breastfeeding popcorn activity - Nursing pain - Pump acquisition-- given edgepark information - ROR Given "Knees and Toes" and discussed importance of reading for brain development and language.   Fundal height and FHR appropriate today  unless noted otherwise in plan. Patient to continue group care.    - CBC; Future - Glucose Tolerance, 2 Hours w/1 Hour; Future - RPR; Future - HIV Antibody (routine testing w rflx); Future  2. Trichomonas vaginalis (TV) infection POS 9/22  TOC in October was positive for BV/Trich  3. History of herpes genitalis PPX at 36 wks  4. Sickle cell trait in mother affecting pregnancy [facility]) Partner negative  Preterm labor symptoms and general obstetric precautions including but not limited to vaginal bleeding, contractions, leaking of fluid and fetal movement were reviewed in detail with the patient. Please refer to After Visit Summary for other counseling recommendations.   No follow-ups on file.  Future Appointments  Date Time Provider [place]  07/25/2022  9:00 AM CENTERING PROVIDER [facility]  07/30/2022  8:30 AM [facility] NURSE [facility]  07/30/2022  8:45 AM [facility] US6 [facility]  08/08/2022  9:00 AM CENTERING PROVIDER [facility]  08/14/2022  8:50 AM [facility]WOCA LAB [facility]  08/22/2022  9:00 AM CENTERING PROVIDER [facility]  09/05/2022  9:00 AM CENTERING PROVIDER [facility]  09/19/2022  9:00 AM CENTERING PROVIDER [facility]  10/03/2022  9:00 AM CENTERING PROVIDER [facility]    Caren Macadam, MD

## 2022-07-25 ENCOUNTER — Ambulatory Visit (INDEPENDENT_AMBULATORY_CARE_PROVIDER_SITE_OTHER): Payer: Medicaid Other | Admitting: Family Medicine

## 2022-07-25 VITALS — BP 117/84 | Wt 187.0 lb

## 2022-07-25 DIAGNOSIS — D573 Sickle-cell trait: Secondary | ICD-10-CM

## 2022-07-25 DIAGNOSIS — Z8619 Personal history of other infectious and parasitic diseases: Secondary | ICD-10-CM

## 2022-07-25 DIAGNOSIS — Z6791 Unspecified blood type, Rh negative: Secondary | ICD-10-CM

## 2022-07-25 DIAGNOSIS — A5901 Trichomonal vulvovaginitis: Secondary | ICD-10-CM

## 2022-07-25 DIAGNOSIS — O99019 Anemia complicating pregnancy, unspecified trimester: Secondary | ICD-10-CM

## 2022-07-25 DIAGNOSIS — Z3480 Encounter for supervision of other normal pregnancy, unspecified trimester: Secondary | ICD-10-CM

## 2022-07-25 DIAGNOSIS — O99012 Anemia complicating pregnancy, second trimester: Secondary | ICD-10-CM

## 2022-07-25 DIAGNOSIS — O26892 Other specified pregnancy related conditions, second trimester: Secondary | ICD-10-CM

## 2022-07-25 DIAGNOSIS — Z3482 Encounter for supervision of other normal pregnancy, second trimester: Secondary | ICD-10-CM

## 2022-07-30 ENCOUNTER — Ambulatory Visit (HOSPITAL_BASED_OUTPATIENT_CLINIC_OR_DEPARTMENT_OTHER): Payer: Medicaid Other

## 2022-07-30 ENCOUNTER — Ambulatory Visit: Payer: Medicaid Other | Attending: Obstetrics | Admitting: *Deleted

## 2022-07-30 VITALS — BP 108/60 | HR 82

## 2022-07-30 DIAGNOSIS — O99012 Anemia complicating pregnancy, second trimester: Secondary | ICD-10-CM | POA: Insufficient documentation

## 2022-07-30 DIAGNOSIS — D573 Sickle-cell trait: Secondary | ICD-10-CM | POA: Insufficient documentation

## 2022-07-30 DIAGNOSIS — Z6791 Unspecified blood type, Rh negative: Secondary | ICD-10-CM | POA: Insufficient documentation

## 2022-07-30 DIAGNOSIS — Z362 Encounter for other antenatal screening follow-up: Secondary | ICD-10-CM

## 2022-07-30 DIAGNOSIS — D563 Thalassemia minor: Secondary | ICD-10-CM | POA: Diagnosis not present

## 2022-07-30 DIAGNOSIS — Z3A00 Weeks of gestation of pregnancy not specified: Secondary | ICD-10-CM | POA: Diagnosis not present

## 2022-07-30 DIAGNOSIS — Z3A26 26 weeks gestation of pregnancy: Secondary | ICD-10-CM | POA: Diagnosis not present

## 2022-07-30 NOTE — Progress Notes (Addendum)
   PRENATAL VISIT NOTE  Subjective:  Theresa Lam is a 26 y.o. G3P1011 at [redacted]w[redacted]d being seen today for ongoing prenatal care.  She is currently monitored for the following issues for this low-risk pregnancy and has Supervision of other normal pregnancy, antepartum; History of herpes genitalis; Sickle cell trait in mother affecting pregnancy (HCC); Trichomonas vaginalis (TV) infection; and Rh negative state in antepartum period on their problem list.  Patient reports no complaints.  Contractions: Not present. Vag. Bleeding: None.  Movement: Present. Denies leaking of fluid.   The following portions of the patient's history were reviewed and updated as appropriate: allergies, current medications, past family history, past medical history, past social history, past surgical history and problem list.   Objective:   Vitals:   07/25/22 1344  BP: 117/84  Weight: 187 lb (84.8 kg)    Fetal Status: Fetal Heart Rate (bpm): 157 Fundal Height: 25 cm Movement: Present     General:  Alert, oriented and cooperative. Patient is in no acute distress.  Skin: Skin is warm and dry. No rash noted.   Cardiovascular: Normal heart rate noted  Respiratory: Normal respiratory effort, no problems with respiration noted  Abdomen: Soft, gravid, appropriate for gestational age.  Pain/Pressure: Absent     Pelvic: Cervical exam deferred        Extremities: Normal range of motion.  Edema: None  Mental Status: Normal mood and affect. Normal behavior. Normal judgment and thought content.   Assessment and Plan:  Pregnancy: G3P1011 at [redacted]w[redacted]d 1. History of herpes genitalis PPX at 36 wks  2. Sickle cell trait in mother affecting pregnancy [facility]) Partner negative  3. Supervision of other normal pregnancy, antepartum Up to date No concerns today Flu shot today Has 3rd trimester lab appt 11/28-- will need labs and rhogam   Centering Pregnancy, Session#5: Reviewed resources in [employer].   Facilitated  discussion today:  Stages of Labor, Sign of labor, labor positions, coping strategies.   Fundal height and FHR appropriate today unless noted otherwise in plan. Patient to continue group care.    4. Trichomonas vaginalis (TV) infection TOC negative  Preterm labor symptoms and general obstetric precautions including but not limited to vaginal bleeding, contractions, leaking of fluid and fetal movement were reviewed in detail with the patient. Please refer to After Visit Summary for other counseling recommendations.   Return in about 2 weeks (around 08/08/2022) for Centering.  Future Appointments  Date Time Provider Department Center  07/30/2022  8:45 AM [facility] US7 [facility]  08/08/2022  9:00 AM CENTERING PROVIDER [facility]  08/14/2022  8:50 AM [facility] LAB [facility]  08/22/2022  9:00 AM CENTERING PROVIDER [facility]  09/05/2022  9:00 AM CENTERING PROVIDER [facility]  09/19/2022  9:00 AM CENTERING PROVIDER [facility]  10/03/2022  9:00 AM CENTERING PROVIDER [facility]    Federico Flake, MD

## 2022-07-30 NOTE — Patient Instructions (Signed)
DOULA LIST   [facility]oul[facility]  Sierra Bizzell  [phone]  [email or web address]  [email or web address]  [facility] Doula Zula Price [phone]  [email or web address]   [facility]   Precious J. Bradley   [email or web address]   ??THE MOTHERLY DOULA?? [name]   [phone]   [email or web address]     [facility]  [name]  [phone]    [email or web address]   [facility]  [name]     [phone]     [email or web address]   [name]: Doula & Photographer   [name] [phone]       [email or web address]  [email or web address]   [name] [facility]  [name] [phone]   [email or web address]   [facility]  [name]  [phone]  [email or web address]   [email or web address]   [facility]   [name]   [phone]  [email or web address]   [name] [facility] D[facility]a  [name]  [phone] [email or web address]  [facility]  [name]           [name]         [name]       [name]     [phone] [email or web address]  [email or web address]   [facility]   [name] [phone] [email or web address]   [facility]  [name]     [phone]  [email or web address]  Soleil Doula  [name]     [phone]  [email or web address]  Facebook and IG [email or web address]   [name]  [phone] [email or web address]    [name] [phone] [email or web address]   [name]  [phone] [email or web address]     [name]  [phone] [email or web address]   IG [email or web address]   [name]    [phone]  [email or web address]   [name]  [phone] [email or web address]    [name]  [phone] [email or web address]   [name]   [email or web address]    [name]    [phone] [email or web address]     [name]  [phone] [email or web address]    [name]  [phone] [email or web address]    [name]    [phone]   [facility] [name]  [phone] [email or web address]  Doula Mama [name] [phone] [email or web address]  [facility] [name]  [phone] [email or web address]  [facility]      [name] [phone]  [email or web address]  [email or web address]   [name]  [phone] [email or web address]   [name] [phone] [email or web address]   [name] [phone] [email or web address]   [name]  [phone] [email or web address]  [facility] [employer]  [name]  [phone] [email or web address]   [name] [phone] [email or web address]  MOOR Divine Myeka Dunn  [email or web address]   T-sheana Turner [phone] [email or web address]   [name] [phone] [email or web address]   [name] [phone] [email or web address]         

## 2022-08-07 NOTE — Progress Notes (Signed)
   PRENATAL VISIT NOTE  Subjective:  Theresa Lam is a 26 y.o. G3P1011 at [redacted]w[redacted]d being seen today for ongoing prenatal care.  She is currently monitored for the following issues for this low-risk pregnancy and has Supervision of other normal pregnancy, antepartum; History of herpes genitalis; Sickle cell trait in mother affecting pregnancy (HCC); Trichomonas vaginalis (TV) infection; and Rh negative state in antepartum period on their problem list.  Patient reports no complaints.  Contractions: Not present. Vag. Bleeding: None.  Movement: Present. Denies leaking of fluid.   The following portions of the patient's history were reviewed and updated as appropriate: allergies, current medications, past family history, past medical history, past social history, past surgical history and problem list.   Objective:   Vitals:   08/08/22 0928  BP: 115/65  Pulse: 82  Weight: 192 lb 3.2 oz (87.2 kg)    Fetal Status: Fetal Heart Rate (bpm): 158 Fundal Height: 26 cm Movement: Present     General:  Alert, oriented and cooperative. Patient is in no acute distress.  Skin: Skin is warm and dry. No rash noted.   Cardiovascular: Normal heart rate noted  Respiratory: Normal respiratory effort, no problems with respiration noted  Abdomen: Soft, gravid, appropriate for gestational age.  Pain/Pressure: Absent     Pelvic: Cervical exam deferred        Extremities: Normal range of motion.  Edema: None  Mental Status: Normal mood and affect. Normal behavior. Normal judgment and thought content.   Assessment and Plan:  Pregnancy: G3P1011 at [redacted]w[redacted]d 1. Supervision of other normal pregnancy, antepartum - 28 week labs 11/28  2. History of herpes genitalis - Valtrex at 36 weeks  3. Sickle cell trait in mother affecting pregnancy (HCC) - Pt reports partner negative  4. Trichomonas vaginalis (TV) infection - TOC neg  5. Rh negative state in antepartum period - Rhophylac given  6. [redacted] weeks  gestation of pregnancy - GTT 11/28   Centering Pregnancy, Session#6: Reviewed resources in [employer].  Facilitated discussion today: family planning/reproductive life planning, coping strategies   Mindfulness activity with positive affirmations   Fundal height and FHR appropriate today unless noted otherwise in plan. Patient to continue group care.   Preterm labor symptoms and general obstetric precautions including but not limited to vaginal bleeding, contractions, leaking of fluid and fetal movement were reviewed in detail with the patient. Please refer to After Visit Summary for other counseling recommendations.   No follow-ups on file.  Future Appointments  Date Time Provider Department Center  08/14/2022  8:50 AM [facility] LAB [facility]  08/14/2022  9:20 AM [facility] NURSE [facility]  08/22/2022  9:00 AM CENTERING PROVIDER [facility]  09/05/2022  9:00 AM CENTERING PROVIDER [facility]  09/19/2022  9:00 AM CENTERING PROVIDER [facility]  10/03/2022  9:00 AM CENTERING PROVIDER [facility]    Dorathy Kinsman, CNM

## 2022-08-08 ENCOUNTER — Ambulatory Visit (INDEPENDENT_AMBULATORY_CARE_PROVIDER_SITE_OTHER): Payer: Medicaid Other | Admitting: Advanced Practice Midwife

## 2022-08-08 VITALS — BP 115/65 | HR 82 | Wt 192.2 lb

## 2022-08-08 DIAGNOSIS — Z6791 Unspecified blood type, Rh negative: Secondary | ICD-10-CM

## 2022-08-08 DIAGNOSIS — O99019 Anemia complicating pregnancy, unspecified trimester: Secondary | ICD-10-CM

## 2022-08-08 DIAGNOSIS — Z1332 Encounter for screening for maternal depression: Secondary | ICD-10-CM

## 2022-08-08 DIAGNOSIS — D573 Sickle-cell trait: Secondary | ICD-10-CM

## 2022-08-08 DIAGNOSIS — Z3A27 27 weeks gestation of pregnancy: Secondary | ICD-10-CM

## 2022-08-08 DIAGNOSIS — Z3480 Encounter for supervision of other normal pregnancy, unspecified trimester: Secondary | ICD-10-CM | POA: Diagnosis not present

## 2022-08-08 DIAGNOSIS — O26899 Other specified pregnancy related conditions, unspecified trimester: Secondary | ICD-10-CM

## 2022-08-08 DIAGNOSIS — Z23 Encounter for immunization: Secondary | ICD-10-CM | POA: Diagnosis not present

## 2022-08-08 DIAGNOSIS — Z8619 Personal history of other infectious and parasitic diseases: Secondary | ICD-10-CM

## 2022-08-08 DIAGNOSIS — A5901 Trichomonal vulvovaginitis: Secondary | ICD-10-CM

## 2022-08-08 MED ORDER — RHO D IMMUNE GLOBULIN 1500 UNIT/2ML IJ SOSY
300.0000 ug | PREFILLED_SYRINGE | Freq: Once | INTRAMUSCULAR | Status: AC
  Administered 2022-08-08: 300 ug via INTRAMUSCULAR

## 2022-08-13 ENCOUNTER — Other Ambulatory Visit: Payer: Self-pay

## 2022-08-13 DIAGNOSIS — Z3480 Encounter for supervision of other normal pregnancy, unspecified trimester: Secondary | ICD-10-CM

## 2022-08-14 ENCOUNTER — Other Ambulatory Visit: Payer: Medicaid Other

## 2022-08-14 ENCOUNTER — Ambulatory Visit (INDEPENDENT_AMBULATORY_CARE_PROVIDER_SITE_OTHER): Payer: Medicaid Other

## 2022-08-14 ENCOUNTER — Other Ambulatory Visit: Payer: Self-pay

## 2022-08-14 VITALS — BP 119/65 | HR 83 | Ht 69.0 in | Wt 196.4 lb

## 2022-08-14 DIAGNOSIS — Z2913 Encounter for prophylactic Rho(D) immune globulin: Secondary | ICD-10-CM | POA: Diagnosis not present

## 2022-08-14 DIAGNOSIS — Z3480 Encounter for supervision of other normal pregnancy, unspecified trimester: Secondary | ICD-10-CM

## 2022-08-14 MED ORDER — RHO D IMMUNE GLOBULIN 1500 UNIT/2ML IJ SOSY
300.0000 ug | PREFILLED_SYRINGE | Freq: Once | INTRAMUSCULAR | Status: AC
  Administered 2022-08-14: 300 ug via INTRAMUSCULAR

## 2022-08-14 NOTE — Progress Notes (Signed)
Shaylon Weingarten presents today for her 2nd dose of Rhogam. Received 1st dose on 08/08/22.     Notified Dr. Crissie Reese that per chart review, pt received rhogam on 08/08/22. Pt did not mention about receiving injection on 08/08/22. Rhogam administered in RUQ on R glute; reviewed administration with Dr. Crissie Reese who stated that pt given two doses is not contraindicated.  FHR today 143. Positive fetal movement.   Attempted to call patient at mobile/preferred number listed in pt chart--no answer and unable to leave voicemail. Will attempt to call again at end of day today.   Cinda Quest, RN  08/14/22

## 2022-08-15 LAB — CBC
Hematocrit: 31.9 % — ABNORMAL LOW (ref 34.0–46.6)
Hemoglobin: 10.9 g/dL — ABNORMAL LOW (ref 11.1–15.9)
MCH: 28.3 pg (ref 26.6–33.0)
MCHC: 34.2 g/dL (ref 31.5–35.7)
MCV: 83 fL (ref 79–97)
Platelets: 166 10*3/uL (ref 150–450)
RBC: 3.85 x10E6/uL (ref 3.77–5.28)
RDW: 11.1 % — ABNORMAL LOW (ref 11.7–15.4)
WBC: 7.5 10*3/uL (ref 3.4–10.8)

## 2022-08-15 LAB — RPR: RPR Ser Ql: NONREACTIVE

## 2022-08-15 LAB — GLUCOSE TOLERANCE, 2 HOURS W/ 1HR
Glucose, 1 hour: 103 mg/dL (ref 70–179)
Glucose, 2 hour: 100 mg/dL (ref 70–152)
Glucose, Fasting: 76 mg/dL (ref 70–91)

## 2022-08-15 LAB — HIV ANTIBODY (ROUTINE TESTING W REFLEX): HIV Screen 4th Generation wRfx: NONREACTIVE

## 2022-08-22 ENCOUNTER — Encounter: Payer: Self-pay | Admitting: *Deleted

## 2022-08-22 ENCOUNTER — Ambulatory Visit (INDEPENDENT_AMBULATORY_CARE_PROVIDER_SITE_OTHER): Payer: Medicaid Other | Admitting: Family Medicine

## 2022-08-22 VITALS — BP 109/70 | HR 94 | Wt 198.2 lb

## 2022-08-22 DIAGNOSIS — Z3480 Encounter for supervision of other normal pregnancy, unspecified trimester: Secondary | ICD-10-CM

## 2022-08-22 DIAGNOSIS — O99013 Anemia complicating pregnancy, third trimester: Secondary | ICD-10-CM

## 2022-08-22 DIAGNOSIS — Z6791 Unspecified blood type, Rh negative: Secondary | ICD-10-CM

## 2022-08-22 DIAGNOSIS — Z3A29 29 weeks gestation of pregnancy: Secondary | ICD-10-CM

## 2022-08-22 DIAGNOSIS — Z3483 Encounter for supervision of other normal pregnancy, third trimester: Secondary | ICD-10-CM

## 2022-08-22 DIAGNOSIS — O26893 Other specified pregnancy related conditions, third trimester: Secondary | ICD-10-CM

## 2022-08-22 DIAGNOSIS — A5901 Trichomonal vulvovaginitis: Secondary | ICD-10-CM

## 2022-08-22 DIAGNOSIS — Z8619 Personal history of other infectious and parasitic diseases: Secondary | ICD-10-CM

## 2022-08-22 DIAGNOSIS — D573 Sickle-cell trait: Secondary | ICD-10-CM

## 2022-08-22 NOTE — Progress Notes (Signed)
   PRENATAL VISIT NOTE  Subjective:  Theresa Lam is a 26 y.o. G3P1011 at [redacted]w[redacted]d being seen today for ongoing prenatal care.  She is currently monitored for the following issues for this low-risk pregnancy and has Supervision of other normal pregnancy, antepartum; History of herpes genitalis; Sickle cell trait in mother affecting pregnancy (HCC); Trichomonas vaginalis (TV) infection; and Rh negative state in antepartum period on their problem list.  Patient reports no complaints.  Contractions: Not present. Vag. Bleeding: None.  Movement: Present. Denies leaking of fluid.   The following portions of the patient's history were reviewed and updated as appropriate: allergies, current medications, past family history, past medical history, past social history, past surgical history and problem list.   Objective:   Vitals:   08/22/22 0908  BP: 109/70  Pulse: 94  Weight: 198 lb 3.2 oz (89.9 kg)    Fetal Status: Fetal Heart Rate (bpm): 144 Fundal Height: 30 cm Movement: Present     General:  Alert, oriented and cooperative. Patient is in no acute distress.  Skin: Skin is warm and dry. No rash noted.   Cardiovascular: Normal heart rate noted  Respiratory: Normal respiratory effort, no problems with respiration noted  Abdomen: Soft, gravid, appropriate for gestational age.  Pain/Pressure: Absent     Pelvic: Cervical exam deferred        Extremities: Normal range of motion.     Mental Status: Normal mood and affect. Normal behavior. Normal judgment and thought content.   Assessment and Plan:  Pregnancy: G3P1011 at [redacted]w[redacted]d 1. Trichomonas vaginalis (TV) infection TOC neg in october  2. Supervision of other normal pregnancy, antepartum  Centering Pregnancy, Session#7: Reviewed resources in [employer].   Facilitated discussion today:  postpartum communication and postpartum mood changes Mindfulness activity with mindful listening  Fundal height and FHR appropriate today  unless noted otherwise in plan. Patient to continue group care.   3. Sickle cell trait in mother affecting pregnancy [facility]) Partner testing negative per their report  4. Rh negative state in antepartum period Received rhogam  5. History of herpes genitalis Needs PPX at 32wks  Preterm labor symptoms and general obstetric precautions including but not limited to vaginal bleeding, contractions, leaking of fluid and fetal movement were reviewed in detail with the patient. Please refer to After Visit Summary for other counseling recommendations.   No follow-ups on file.  Future Appointments  Date Time Provider Department Center  09/05/2022  9:00 AM CENTERING PROVIDER [facility]  09/19/2022  9:00 AM CENTERING PROVIDER [facility]  10/03/2022  9:00 AM CENTERING PROVIDER [facility]  10/10/2022  1:15 PM Kathlene Cote [facility]  10/17/2022  8:15 AM Bernerd Limbo, CNM [facility]    Federico Flake, MD

## 2022-09-05 ENCOUNTER — Ambulatory Visit (INDEPENDENT_AMBULATORY_CARE_PROVIDER_SITE_OTHER): Payer: Medicaid Other | Admitting: Family Medicine

## 2022-09-05 VITALS — BP 115/75 | HR 103 | Wt 203.8 lb

## 2022-09-05 DIAGNOSIS — D573 Sickle-cell trait: Secondary | ICD-10-CM

## 2022-09-05 DIAGNOSIS — Z6791 Unspecified blood type, Rh negative: Secondary | ICD-10-CM

## 2022-09-05 DIAGNOSIS — Z3A31 31 weeks gestation of pregnancy: Secondary | ICD-10-CM

## 2022-09-05 DIAGNOSIS — A5901 Trichomonal vulvovaginitis: Secondary | ICD-10-CM

## 2022-09-05 DIAGNOSIS — Z3483 Encounter for supervision of other normal pregnancy, third trimester: Secondary | ICD-10-CM

## 2022-09-05 DIAGNOSIS — Z3480 Encounter for supervision of other normal pregnancy, unspecified trimester: Secondary | ICD-10-CM

## 2022-09-05 DIAGNOSIS — O26893 Other specified pregnancy related conditions, third trimester: Secondary | ICD-10-CM

## 2022-09-05 DIAGNOSIS — Z8619 Personal history of other infectious and parasitic diseases: Secondary | ICD-10-CM

## 2022-09-05 DIAGNOSIS — O99013 Anemia complicating pregnancy, third trimester: Secondary | ICD-10-CM

## 2022-09-05 NOTE — Progress Notes (Signed)
   PRENATAL VISIT NOTE  Subjective:  Theresa Lam is a 26 y.o. G3P1011 at [redacted]w[redacted]d being seen today for ongoing prenatal care.  She is currently monitored for the following issues for this low-risk pregnancy and has Supervision of other normal pregnancy, antepartum; History of herpes genitalis; Sickle cell trait in mother affecting pregnancy (HCC); Trichomonas vaginalis (TV) infection; and Rh negative state in antepartum period on their problem list.  Patient reports no complaints.  Contractions: Not present. Vag. Bleeding: None.  Movement: Present. Denies leaking of fluid.   The following portions of the patient's history were reviewed and updated as appropriate: allergies, current medications, past family history, past medical history, past social history, past surgical history and problem list.   Objective:   Vitals:   09/05/22 1123  BP: 115/75  Pulse: (!) 103  Weight: 203 lb 12.8 oz (92.4 kg)    Fetal Status: Fetal Heart Rate (bpm): 154 Fundal Height: 31 cm Movement: Present  Presentation: Vertex  General:  Alert, oriented and cooperative. Patient is in no acute distress.  Skin: Skin is warm and dry. No rash noted.   Cardiovascular: Normal heart rate noted  Respiratory: Normal respiratory effort, no problems with respiration noted  Abdomen: Soft, gravid, appropriate for gestational age.  Pain/Pressure: Absent     Pelvic: Cervical exam deferred        Extremities: Normal range of motion.  Edema: None  Mental Status: Normal mood and affect. Normal behavior. Normal judgment and thought content.   Assessment and Plan:  Pregnancy: G3P1011 at [redacted]w[redacted]d 1. Trichomonas vaginalis (TV) infection TOC neg 10/10  2. Supervision of other normal pregnancy, antepartum   Centering Pregnancy, Session#8: Reviewed resources in [employer].   Facilitated discussion today: Newborn safety, postpartum and delivery planning  Fundal height and FHR appropriate today unless noted otherwise  in plan. Patient to continue group care.   3. Sickle cell trait in mother affecting pregnancy (HCC)  4. Rh negative state in antepartum period Received rhogam at last visit  5. History of herpes genitalis Will need PPX at 34-36 weks  Preterm labor symptoms and general obstetric precautions including but not limited to vaginal bleeding, contractions, leaking of fluid and fetal movement were reviewed in detail with the patient. Please refer to After Visit Summary for other counseling recommendations.   Return in about 2 weeks (around 09/19/2022) for Centering Pregnancy.  Future Appointments  Date Time Provider Department Center  09/19/2022  9:00 AM CENTERING PROVIDER [facility]  10/03/2022  9:00 AM CENTERING PROVIDER [facility]  10/10/2022  1:15 PM Kathlene Cote [facility]  10/17/2022  8:15 AM Bernerd Limbo, CNM [facility]    Federico Flake, MD

## 2022-09-17 NOTE — L&D Delivery Note (Signed)
OB/GYN Faculty Practice Delivery Note  Theresa Lam is a 27 y.o. G3P1011 s/p SVB at [redacted]w[redacted]d She was admitted for latent labor.   ROM: 0h 050mith clear fluid GBS Status: neg Maximum Maternal Temperature: 98.3  Labor Progress: Ms ChMateras changed from 1.5cm to 4cm in MAU, was admitted and then progressed to complete and delivery within 2 hours  Delivery Date/Time: February 10th, 2024 at 0345 Delivery: Called to room and patient was complete and pushing. Head delivered ROA. Nuchal cord present x 1, loose. Shoulder and body delivered in usual fashion. Infant with spontaneous cry, placed on mother's abdomen, dried and stimulated. Cord clamped x 2 after 1-minute delay, and cut by FOB. Cord blood drawn. Placenta delivered spontaneously with gentle cord traction. Fundus firm with massage and Pitocin. Labia, perineum, vagina, and cervix inspected and found to be intact.   Placenta: spont, intact; to L&D Complications: none Lacerations: none EBL: 88cc Analgesia: epidural  Postpartum Planning [x]$  message to sent to schedule follow-up   Infant: boy  APGARs 8/9  3147g (6lb 15oz)  KiMyrtis SerCNM  10/27/2022 4:03 AM

## 2022-09-19 ENCOUNTER — Ambulatory Visit (INDEPENDENT_AMBULATORY_CARE_PROVIDER_SITE_OTHER): Payer: Medicaid Other | Admitting: Family Medicine

## 2022-09-19 VITALS — BP 111/72 | HR 80 | Wt 206.2 lb

## 2022-09-19 DIAGNOSIS — Z6791 Unspecified blood type, Rh negative: Secondary | ICD-10-CM

## 2022-09-19 DIAGNOSIS — O99013 Anemia complicating pregnancy, third trimester: Secondary | ICD-10-CM

## 2022-09-19 DIAGNOSIS — Z3A33 33 weeks gestation of pregnancy: Secondary | ICD-10-CM

## 2022-09-19 DIAGNOSIS — Z8619 Personal history of other infectious and parasitic diseases: Secondary | ICD-10-CM

## 2022-09-19 DIAGNOSIS — Z3483 Encounter for supervision of other normal pregnancy, third trimester: Secondary | ICD-10-CM | POA: Diagnosis not present

## 2022-09-19 DIAGNOSIS — O26899 Other specified pregnancy related conditions, unspecified trimester: Secondary | ICD-10-CM

## 2022-09-19 DIAGNOSIS — O26893 Other specified pregnancy related conditions, third trimester: Secondary | ICD-10-CM

## 2022-09-19 DIAGNOSIS — D573 Sickle-cell trait: Secondary | ICD-10-CM

## 2022-09-19 DIAGNOSIS — Z3480 Encounter for supervision of other normal pregnancy, unspecified trimester: Secondary | ICD-10-CM

## 2022-09-19 DIAGNOSIS — A5901 Trichomonal vulvovaginitis: Secondary | ICD-10-CM

## 2022-09-19 NOTE — Progress Notes (Signed)
   PRENATAL VISIT NOTE  Subjective:  Theresa Lam is a 27 y.o. G3P1011 at [redacted]w[redacted]d being seen today for ongoing prenatal care.  She is currently monitored for the following issues for this low-risk pregnancy and has Supervision of other normal pregnancy, antepartum; History of herpes genitalis; Sickle cell trait in mother affecting pregnancy ([place]); Trichomonas vaginalis (TV) infection; and Rh negative state in antepartum period on their problem list.  Patient reports no complaints.  Contractions: Not present. Vag. Bleeding: None.  Movement: Present. Denies leaking of fluid.   The following portions of the patient's history were reviewed and updated as appropriate: allergies, current medications, past family history, past medical history, past social history, past surgical history and problem list.   Objective:   Vitals:   09/19/22 0918  BP: 111/72  Pulse: 80  Weight: 206 lb 3.2 oz (93.5 kg)    Fetal Status: Fetal Heart Rate (bpm): 145 Fundal Height: 33 cm Movement: Present  Presentation: Vertex  General:  Alert, oriented and cooperative. Patient is in no acute distress.  Skin: Skin is warm and dry. No rash noted.   Cardiovascular: Normal heart rate noted  Respiratory: Normal respiratory effort, no problems with respiration noted  Abdomen: Soft, gravid, appropriate for gestational age.  Pain/Pressure: Absent     Pelvic: Cervical exam deferred        Extremities: Normal range of motion.  Edema: None  Mental Status: Normal mood and affect. Normal behavior. Normal judgment and thought content.   Assessment and Plan:  Pregnancy: G3P1011 at [redacted]w[redacted]d 1. Supervision of other normal pregnancy, antepartum  Centering Pregnancy, Session#9: Reviewed resources in [employer].   Facilitated discussion today:  infant care, breastfeeding, answering questions, LD policies, labor coping Mindfulness activity with visualization of a first hour after birth as well as relaxation breathing when  thinking about fears or unexpected outcomes for childbirth preparation  Fundal height and FHR appropriate today unless noted otherwise in plan. Patient to continue group care.    2. History of herpes genitalis PPX at 34-36   3. Rh negative state in antepartum period Received rhogam  4. Sickle cell trait in mother affecting pregnancy ([place])  5. Trichomonas vaginalis (TV) infection TOC neg  Preterm labor symptoms and general obstetric precautions including but not limited to vaginal bleeding, contractions, leaking of fluid and fetal movement were reviewed in detail with the patient. Please refer to After Visit Summary for other counseling recommendations.   Return in about 2 weeks (around 10/03/2022) for Centering Pregnancy.  Future Appointments  Date Time Provider [place]  10/03/2022  9:00 AM CENTERING PROVIDER [facility]  10/10/2022  1:15 PM Danielle Rankin [facility]  10/17/2022  8:15 AM Gabriel Carina, CNM [facility]    Caren Macadam, MD

## 2022-10-03 ENCOUNTER — Encounter: Payer: Self-pay | Admitting: Family Medicine

## 2022-10-03 ENCOUNTER — Ambulatory Visit (INDEPENDENT_AMBULATORY_CARE_PROVIDER_SITE_OTHER): Payer: Medicaid Other | Admitting: Family Medicine

## 2022-10-03 VITALS — BP 116/77 | HR 108 | Wt 217.2 lb

## 2022-10-03 DIAGNOSIS — Z3483 Encounter for supervision of other normal pregnancy, third trimester: Secondary | ICD-10-CM

## 2022-10-03 DIAGNOSIS — Z8619 Personal history of other infectious and parasitic diseases: Secondary | ICD-10-CM

## 2022-10-03 DIAGNOSIS — O26893 Other specified pregnancy related conditions, third trimester: Secondary | ICD-10-CM

## 2022-10-03 DIAGNOSIS — Z3480 Encounter for supervision of other normal pregnancy, unspecified trimester: Secondary | ICD-10-CM

## 2022-10-03 DIAGNOSIS — Z3A35 35 weeks gestation of pregnancy: Secondary | ICD-10-CM

## 2022-10-03 DIAGNOSIS — O26899 Other specified pregnancy related conditions, unspecified trimester: Secondary | ICD-10-CM

## 2022-10-03 DIAGNOSIS — Z6791 Unspecified blood type, Rh negative: Secondary | ICD-10-CM

## 2022-10-03 DIAGNOSIS — A5901 Trichomonal vulvovaginitis: Secondary | ICD-10-CM

## 2022-10-03 MED ORDER — VALACYCLOVIR HCL 500 MG PO TABS: 500.0000 mg | ORAL_TABLET | Freq: Two times a day (BID) | ORAL | 6 refills | Status: DC

## 2022-10-03 NOTE — Progress Notes (Signed)
   PRENATAL VISIT NOTE  Subjective:  Theresa Lam is a 27 y.o. G3P1011 at [redacted]w[redacted]d being seen today for ongoing prenatal care.  She is currently monitored for the following issues for this low-risk pregnancy and has Supervision of other normal pregnancy, antepartum; History of herpes genitalis; Sickle cell trait in mother affecting pregnancy ([place]); Trichomonas vaginalis (TV) infection; and Rh negative state in antepartum period on their problem list.  Patient reports no complaints.  Contractions: Not present. Vag. Bleeding: None.  Movement: Present. Denies leaking of fluid.   The following portions of the patient's history were reviewed and updated as appropriate: allergies, current medications, past family history, past medical history, past social history, past surgical history and problem list.   Objective:   Vitals:   10/03/22 1111  BP: 116/77  Pulse: (!) 108  Weight: 217 lb 3.2 oz (98.5 kg)    Fetal Status: Fetal Heart Rate (bpm): 143   Movement: Present     General:  Alert, oriented and cooperative. Patient is in no acute distress.  Skin: Skin is warm and dry. No rash noted.   Cardiovascular: Normal heart rate noted  Respiratory: Normal respiratory effort, no problems with respiration noted  Abdomen: Soft, gravid, appropriate for gestational age.  Pain/Pressure: Absent     Pelvic: Cervical exam deferred        Extremities: Normal range of motion.     Mental Status: Normal mood and affect. Normal behavior. Normal judgment and thought content.   Assessment and Plan:  Pregnancy: G3P1011 at [redacted]w[redacted]d 1. History of herpes genitalis - valACYclovir (VALTREX) 500 MG tablet; Take 1 tablet (500 mg total) by mouth 2 (two) times daily.  Dispense: 60 tablet; Refill: 6  2. Supervision of other normal pregnancy, antepartum Doing well today No concerns Reviewed swabs at next visit  3. Rh negative state in antepartum period Received rhogam  4. Trichomonas vaginalis (TV)  infection TOC neg  Preterm labor symptoms and general obstetric precautions including but not limited to vaginal bleeding, contractions, leaking of fluid and fetal movement were reviewed in detail with the patient. Please refer to After Visit Summary for other counseling recommendations.   Return in about 1 week (around 10/10/2022) for Routine prenatal care.  Future Appointments  Date Time Provider [place]  10/10/2022  1:15 PM Danielle Rankin [facility]  10/17/2022 10:55 AM Caren Macadam, MD [facility]    Caren Macadam, MD

## 2022-10-10 ENCOUNTER — Ambulatory Visit (INDEPENDENT_AMBULATORY_CARE_PROVIDER_SITE_OTHER): Payer: Medicaid Other | Admitting: Medical

## 2022-10-10 ENCOUNTER — Encounter: Payer: Self-pay | Admitting: Medical

## 2022-10-10 ENCOUNTER — Other Ambulatory Visit: Payer: Self-pay

## 2022-10-10 ENCOUNTER — Other Ambulatory Visit (HOSPITAL_COMMUNITY)
Admission: RE | Admit: 2022-10-10 | Discharge: 2022-10-10 | Disposition: A | Discharge status: Home / Self Care | Payer: Medicaid Other | Source: Ambulatory Visit | Attending: Medical | Admitting: Medical

## 2022-10-10 VITALS — BP 119/71 | HR 83 | Wt 218.0 lb

## 2022-10-10 DIAGNOSIS — Z3A36 36 weeks gestation of pregnancy: Secondary | ICD-10-CM

## 2022-10-10 DIAGNOSIS — D573 Sickle-cell trait: Secondary | ICD-10-CM

## 2022-10-10 DIAGNOSIS — Z6791 Unspecified blood type, Rh negative: Secondary | ICD-10-CM

## 2022-10-10 DIAGNOSIS — Z3480 Encounter for supervision of other normal pregnancy, unspecified trimester: Secondary | ICD-10-CM | POA: Insufficient documentation

## 2022-10-10 DIAGNOSIS — O99013 Anemia complicating pregnancy, third trimester: Secondary | ICD-10-CM

## 2022-10-10 DIAGNOSIS — O26893 Other specified pregnancy related conditions, third trimester: Secondary | ICD-10-CM

## 2022-10-10 DIAGNOSIS — Z0289 Encounter for other administrative examinations: Secondary | ICD-10-CM

## 2022-10-10 DIAGNOSIS — Z8619 Personal history of other infectious and parasitic diseases: Secondary | ICD-10-CM

## 2022-10-10 DIAGNOSIS — O26899 Other specified pregnancy related conditions, unspecified trimester: Secondary | ICD-10-CM

## 2022-10-10 NOTE — Progress Notes (Signed)
   PRENATAL VISIT NOTE  Subjective:  Theresa Lam is a 27 y.o. G3P1011 at [redacted]w[redacted]d being seen today for ongoing prenatal care.  She is currently monitored for the following issues for this low-risk pregnancy and has Supervision of other normal pregnancy, antepartum; History of herpes genitalis; Sickle cell trait in mother affecting pregnancy ([place]); Trichomonas vaginalis (TV) infection; and Rh negative state in antepartum period on their problem list.  Patient reports occasional contractions.  Contractions: Irritability. Vag. Bleeding: None.  Movement: Present. Denies leaking of fluid.   The following portions of the patient's history were reviewed and updated as appropriate: allergies, current medications, past family history, past medical history, past social history, past surgical history and problem list.   Objective:   Vitals:   10/10/22 1325  BP: 119/71  Pulse: 83  Weight: 218 lb (98.9 kg)    Fetal Status: Fetal Heart Rate (bpm): 139 Fundal Height: 36 cm Movement: Present     General:  Alert, oriented and cooperative. Patient is in no acute distress.  Skin: Skin is warm and dry. No rash noted.   Cardiovascular: Normal heart rate noted  Respiratory: Normal respiratory effort, no problems with respiration noted  Abdomen: Soft, gravid, appropriate for gestational age.  Pain/Pressure: Present     Pelvic: Cervical exam deferred        Extremities: Normal range of motion.  Edema: Trace  Mental Status: Normal mood and affect. Normal behavior. Normal judgment and thought content.   Assessment and Plan:  Pregnancy: G3P1011 at [redacted]w[redacted]d 1. Supervision of other normal pregnancy, antepartum - GC/Chlamydia probe amp ()not at [employer] [facility] - Culture, beta strep (group b only)  2. Sickle cell trait in mother affecting pregnancy ([place])  3. Rh negative state in antepartum period - Rhogam given at 28 weeks   4. History of herpes genitalis - On Valtex  5. [redacted] weeks gestation of  pregnancy  Preterm labor symptoms and general obstetric precautions including but not limited to vaginal bleeding, contractions, leaking of fluid and fetal movement were reviewed in detail with the patient. Please refer to After Visit Summary for other counseling recommendations.   Return in about 1 week (around 10/17/2022) for LOB, In-Person.  Future Appointments  Date Time Provider Department Center  10/17/2022 10:55 AM Caren Macadam, MD [facility]  10/25/2022  2:15 PM Darliss Cheney, MD [facility]  10/31/2022 10:15 AM Caren Macadam, MD [facility]  11/07/2022 10:15 AM [facility]  11/07/2022 11:15 AM Caren Macadam, MD [facility]    Kerry Hough, PA-C

## 2022-10-11 LAB — GC/CHLAMYDIA PROBE AMP (~~LOC~~) NOT AT ARMC
Chlamydia: NEGATIVE
Comment: NEGATIVE
Comment: NORMAL
Neisseria Gonorrhea: NEGATIVE

## 2022-10-14 LAB — CULTURE, BETA STREP (GROUP B ONLY): Strep Gp B Culture: NEGATIVE

## 2022-10-17 ENCOUNTER — Encounter: Payer: Medicaid Other | Admitting: Family Medicine

## 2022-10-17 ENCOUNTER — Encounter: Payer: Self-pay | Admitting: Certified Nurse Midwife

## 2022-10-25 ENCOUNTER — Other Ambulatory Visit: Payer: Self-pay

## 2022-10-25 ENCOUNTER — Ambulatory Visit (INDEPENDENT_AMBULATORY_CARE_PROVIDER_SITE_OTHER): Payer: Medicaid Other | Admitting: Obstetrics and Gynecology

## 2022-10-25 VITALS — BP 111/75 | HR 106 | Wt 222.8 lb

## 2022-10-25 DIAGNOSIS — Z3483 Encounter for supervision of other normal pregnancy, third trimester: Secondary | ICD-10-CM

## 2022-10-25 DIAGNOSIS — Z3480 Encounter for supervision of other normal pregnancy, unspecified trimester: Secondary | ICD-10-CM

## 2022-10-25 DIAGNOSIS — Z3A38 38 weeks gestation of pregnancy: Secondary | ICD-10-CM

## 2022-10-25 NOTE — Progress Notes (Signed)
   PRENATAL VISIT NOTE  Subjective:  Theresa Lam is a 27 y.o. G3P1011 at [redacted]w[redacted]d being seen today for ongoing prenatal care.  She is currently monitored for the following issues for this low-risk pregnancy and has Supervision of other normal pregnancy, antepartum; History of herpes genitalis; Sickle cell trait in mother affecting pregnancy ([place]); Trichomonas vaginalis (TV) infection; and Rh negative state in antepartum period on their problem list.  Patient reports no complaints.  Contractions: Irritability. Vag. Bleeding: None.  Movement: Present. Denies leaking of fluid.   The following portions of the patient's history were reviewed and updated as appropriate: allergies, current medications, past family history, past medical history, past social history, past surgical history and problem list.   Objective:   Vitals:   10/25/22 1426  BP: 111/75  Pulse: (!) 106  Weight: 222 lb 12.8 oz (101.1 kg)    Fetal Status: Fetal Heart Rate (bpm): 146   Movement: Present     General:  Alert, oriented and cooperative. Patient is in no acute distress.  Skin: Skin is warm and dry. No rash noted.   Cardiovascular: Normal heart rate noted  Respiratory: Normal respiratory effort, no problems with respiration noted  Abdomen: Soft, gravid, appropriate for gestational age.  Pain/Pressure: Present     Pelvic: Cervical exam performed in the presence of a chaperone  1CM DILATED, MEMBRANES SWEPT        Extremities: Normal range of motion.  Edema: Trace  Mental Status: Normal mood and affect. Normal behavior. Normal judgment and thought content.   Assessment and Plan:  Pregnancy: G3P1011 at [redacted]w[redacted]d 1. Supervision of other normal pregnancy, antepartum FHR and FH wnl  2. [redacted] weeks gestation of pregnancy Membranes striped  Term labor symptoms and general obstetric precautions including but not limited to vaginal bleeding, contractions, leaking of fluid and fetal movement were reviewed in detail with  the patient. Please refer to After Visit Summary for other counseling recommendations.   No follow-ups on file.  Future Appointments  Date Time Provider [place]  10/30/2022  8:55 AM Minette Brine [facility]  11/07/2022 10:15 AM [facility]  11/07/2022 11:15 AM Caren Macadam, MD [facility]    Darliss Cheney, MD

## 2022-10-26 ENCOUNTER — Inpatient Hospital Stay (HOSPITAL_COMMUNITY)
Admission: AD | Admit: 2022-10-26 | Discharge: 2022-10-28 | DRG: 806 | Disposition: A | Discharge status: Home / Self Care | Payer: Medicaid Other | Attending: Obstetrics & Gynecology | Admitting: Obstetrics & Gynecology

## 2022-10-26 DIAGNOSIS — Z3480 Encounter for supervision of other normal pregnancy, unspecified trimester: Principal | ICD-10-CM

## 2022-10-26 DIAGNOSIS — O26893 Other specified pregnancy related conditions, third trimester: Secondary | ICD-10-CM | POA: Diagnosis present

## 2022-10-26 DIAGNOSIS — O9902 Anemia complicating childbirth: Secondary | ICD-10-CM | POA: Diagnosis present

## 2022-10-26 DIAGNOSIS — Z8619 Personal history of other infectious and parasitic diseases: Secondary | ICD-10-CM | POA: Diagnosis present

## 2022-10-26 DIAGNOSIS — Z87891 Personal history of nicotine dependence: Secondary | ICD-10-CM

## 2022-10-26 DIAGNOSIS — A6000 Herpesviral infection of urogenital system, unspecified: Secondary | ICD-10-CM | POA: Diagnosis present

## 2022-10-26 DIAGNOSIS — O26899 Other specified pregnancy related conditions, unspecified trimester: Secondary | ICD-10-CM

## 2022-10-26 DIAGNOSIS — D573 Sickle-cell trait: Secondary | ICD-10-CM | POA: Diagnosis present

## 2022-10-26 DIAGNOSIS — Z3A38 38 weeks gestation of pregnancy: Secondary | ICD-10-CM

## 2022-10-26 DIAGNOSIS — Z6791 Unspecified blood type, Rh negative: Secondary | ICD-10-CM

## 2022-10-26 DIAGNOSIS — O9832 Other infections with a predominantly sexual mode of transmission complicating childbirth: Secondary | ICD-10-CM | POA: Diagnosis present

## 2022-10-27 ENCOUNTER — Inpatient Hospital Stay (HOSPITAL_COMMUNITY): Payer: Medicaid Other | Admitting: Anesthesiology

## 2022-10-27 ENCOUNTER — Other Ambulatory Visit: Payer: Self-pay

## 2022-10-27 ENCOUNTER — Encounter (HOSPITAL_COMMUNITY): Payer: Self-pay | Admitting: Obstetrics & Gynecology

## 2022-10-27 DIAGNOSIS — O26893 Other specified pregnancy related conditions, third trimester: Secondary | ICD-10-CM | POA: Diagnosis present

## 2022-10-27 DIAGNOSIS — O9902 Anemia complicating childbirth: Secondary | ICD-10-CM | POA: Diagnosis present

## 2022-10-27 DIAGNOSIS — Z87891 Personal history of nicotine dependence: Secondary | ICD-10-CM | POA: Diagnosis not present

## 2022-10-27 DIAGNOSIS — Z6791 Unspecified blood type, Rh negative: Secondary | ICD-10-CM | POA: Diagnosis not present

## 2022-10-27 DIAGNOSIS — Z3A38 38 weeks gestation of pregnancy: Secondary | ICD-10-CM | POA: Diagnosis not present

## 2022-10-27 DIAGNOSIS — D573 Sickle-cell trait: Secondary | ICD-10-CM | POA: Diagnosis present

## 2022-10-27 DIAGNOSIS — A6000 Herpesviral infection of urogenital system, unspecified: Secondary | ICD-10-CM | POA: Diagnosis present

## 2022-10-27 DIAGNOSIS — O9832 Other infections with a predominantly sexual mode of transmission complicating childbirth: Secondary | ICD-10-CM | POA: Diagnosis present

## 2022-10-27 LAB — CBC
HCT: 30.8 % — ABNORMAL LOW (ref 36.0–46.0)
Hemoglobin: 10.5 g/dL — ABNORMAL LOW (ref 12.0–15.0)
MCH: 26.9 pg (ref 26.0–34.0)
MCHC: 34.1 g/dL (ref 30.0–36.0)
MCV: 78.8 fL — ABNORMAL LOW (ref 80.0–100.0)
Platelets: 180 10*3/uL (ref 150–400)
RBC: 3.91 MIL/uL (ref 3.87–5.11)
RDW: 14.4 % (ref 11.5–15.5)
WBC: 7.4 10*3/uL (ref 4.0–10.5)
nRBC: 0 % (ref 0.0–0.2)

## 2022-10-27 LAB — TYPE AND SCREEN
ABO/RH(D): B NEG
Antibody Screen: POSITIVE

## 2022-10-27 LAB — RPR: RPR Ser Ql: NONREACTIVE

## 2022-10-27 MED ORDER — ONDANSETRON HCL 4 MG/2ML IJ SOLN
4.0000 mg | Freq: Four times a day (QID) | INTRAMUSCULAR | Status: DC | PRN
  Administered 2022-10-27: 4 mg via INTRAVENOUS
  Filled 2022-10-27: qty 2

## 2022-10-27 MED ORDER — SENNOSIDES-DOCUSATE SODIUM 8.6-50 MG PO TABS
2.0000 | ORAL_TABLET | ORAL | Status: DC
  Filled 2022-10-27 (×2): qty 2

## 2022-10-27 MED ORDER — RHO D IMMUNE GLOBULIN 1500 UNIT/2ML IJ SOSY
300.0000 ug | PREFILLED_SYRINGE | Freq: Once | INTRAMUSCULAR | Status: AC
  Administered 2022-10-27: 300 ug via INTRAVENOUS
  Filled 2022-10-27: qty 2

## 2022-10-27 MED ORDER — LIDOCAINE HCL (PF) 1 % IJ SOLN: 30.0000 mL | INTRAMUSCULAR | Status: DC | PRN

## 2022-10-27 MED ORDER — BENZOCAINE-MENTHOL 20-0.5 % EX AERO: 1.0000 | INHALATION_SPRAY | CUTANEOUS | Status: DC | PRN

## 2022-10-27 MED ORDER — SOD CITRATE-CITRIC ACID 500-334 MG/5ML PO SOLN: 30.0000 mL | ORAL | Status: DC | PRN

## 2022-10-27 MED ORDER — PHENYLEPHRINE 80 MCG/ML (10ML) SYRINGE FOR IV PUSH (FOR BLOOD PRESSURE SUPPORT): 80.0000 ug | PREFILLED_SYRINGE | INTRAVENOUS | Status: DC | PRN

## 2022-10-27 MED ORDER — LACTATED RINGERS IV SOLN
500.0000 mL | INTRAVENOUS | Status: DC | PRN
  Administered 2022-10-27: 1000 mL via INTRAVENOUS

## 2022-10-27 MED ORDER — WITCH HAZEL-GLYCERIN EX PADS: 1.0000 | MEDICATED_PAD | CUTANEOUS | Status: DC | PRN

## 2022-10-27 MED ORDER — COCONUT OIL OIL
1.0000 | TOPICAL_OIL | Status: DC | PRN
  Administered 2022-10-28: 1 via TOPICAL

## 2022-10-27 MED ORDER — FENTANYL-BUPIVACAINE-NACL 0.5-0.125-0.9 MG/250ML-% EP SOLN
12.0000 mL/h | EPIDURAL | Status: DC | PRN
  Administered 2022-10-27: 12 mL/h via EPIDURAL
  Filled 2022-10-27: qty 250

## 2022-10-27 MED ORDER — DIPHENHYDRAMINE HCL 50 MG/ML IJ SOLN: 12.5000 mg | INTRAMUSCULAR | Status: DC | PRN

## 2022-10-27 MED ORDER — OXYCODONE-ACETAMINOPHEN 5-325 MG PO TABS: 1.0000 | ORAL_TABLET | ORAL | Status: DC | PRN

## 2022-10-27 MED ORDER — IBUPROFEN 600 MG PO TABS
600.0000 mg | ORAL_TABLET | Freq: Four times a day (QID) | ORAL | Status: DC
  Administered 2022-10-27 – 2022-10-28 (×6): 600 mg via ORAL
  Filled 2022-10-27 (×7): qty 1

## 2022-10-27 MED ORDER — ONDANSETRON HCL 4 MG PO TABS: 4.0000 mg | ORAL_TABLET | ORAL | Status: DC | PRN

## 2022-10-27 MED ORDER — ACETAMINOPHEN 325 MG PO TABS: 650.0000 mg | ORAL_TABLET | ORAL | Status: DC | PRN

## 2022-10-27 MED ORDER — TETANUS-DIPHTH-ACELL PERTUSSIS 5-2.5-18.5 LF-MCG/0.5 IM SUSY: 0.5000 mL | PREFILLED_SYRINGE | Freq: Once | INTRAMUSCULAR | Status: DC

## 2022-10-27 MED ORDER — OXYTOCIN BOLUS FROM INFUSION
333.0000 mL | Freq: Once | INTRAVENOUS | Status: AC
  Administered 2022-10-27: 333 mL via INTRAVENOUS

## 2022-10-27 MED ORDER — SIMETHICONE 80 MG PO CHEW: 80.0000 mg | CHEWABLE_TABLET | ORAL | Status: DC | PRN

## 2022-10-27 MED ORDER — DIPHENHYDRAMINE HCL 25 MG PO CAPS: 25.0000 mg | ORAL_CAPSULE | Freq: Four times a day (QID) | ORAL | Status: DC | PRN

## 2022-10-27 MED ORDER — OXYCODONE HCL 5 MG PO TABS
5.0000 mg | ORAL_TABLET | ORAL | Status: DC | PRN
  Administered 2022-10-27: 5 mg via ORAL
  Filled 2022-10-27: qty 1

## 2022-10-27 MED ORDER — ZOLPIDEM TARTRATE 5 MG PO TABS: 5.0000 mg | ORAL_TABLET | Freq: Every evening | ORAL | Status: DC | PRN

## 2022-10-27 MED ORDER — EPHEDRINE 5 MG/ML INJ: 10.0000 mg | INTRAVENOUS | Status: DC | PRN

## 2022-10-27 MED ORDER — ONDANSETRON HCL 4 MG/2ML IJ SOLN: 4.0000 mg | INTRAMUSCULAR | Status: DC | PRN

## 2022-10-27 MED ORDER — PHENYLEPHRINE 80 MCG/ML (10ML) SYRINGE FOR IV PUSH (FOR BLOOD PRESSURE SUPPORT)
80.0000 ug | PREFILLED_SYRINGE | INTRAVENOUS | Status: DC | PRN
  Filled 2022-10-27: qty 10

## 2022-10-27 MED ORDER — LACTATED RINGERS IV SOLN: INTRAVENOUS | Status: DC

## 2022-10-27 MED ORDER — LIDOCAINE-EPINEPHRINE (PF) 2 %-1:200000 IJ SOLN
INTRAMUSCULAR | Status: DC | PRN
  Administered 2022-10-27: 5 mL via EPIDURAL

## 2022-10-27 MED ORDER — ACETAMINOPHEN 325 MG PO TABS
650.0000 mg | ORAL_TABLET | ORAL | Status: DC | PRN
  Administered 2022-10-27 – 2022-10-28 (×3): 650 mg via ORAL
  Filled 2022-10-27 (×3): qty 2

## 2022-10-27 MED ORDER — FENTANYL CITRATE (PF) 100 MCG/2ML IJ SOLN: 100.0000 ug | INTRAMUSCULAR | Status: DC | PRN

## 2022-10-27 MED ORDER — PRENATAL MULTIVITAMIN CH
1.0000 | ORAL_TABLET | Freq: Every day | ORAL | Status: DC
  Administered 2022-10-27: 1 via ORAL
  Filled 2022-10-27: qty 1

## 2022-10-27 MED ORDER — DIBUCAINE (PERIANAL) 1 % EX OINT: 1.0000 | TOPICAL_OINTMENT | CUTANEOUS | Status: DC | PRN

## 2022-10-27 MED ORDER — OXYCODONE-ACETAMINOPHEN 5-325 MG PO TABS: 2.0000 | ORAL_TABLET | ORAL | Status: DC | PRN

## 2022-10-27 MED ORDER — MEASLES, MUMPS & RUBELLA VAC IJ SOLR: 0.5000 mL | Freq: Once | INTRAMUSCULAR | Status: DC

## 2022-10-27 MED ORDER — LACTATED RINGERS IV SOLN: 500.0000 mL | Freq: Once | INTRAVENOUS | Status: DC

## 2022-10-27 MED ORDER — OXYTOCIN-SODIUM CHLORIDE 30-0.9 UT/500ML-% IV SOLN
2.5000 [IU]/h | INTRAVENOUS | Status: DC
  Administered 2022-10-27: 2.5 [IU]/h via INTRAVENOUS
  Filled 2022-10-27: qty 500

## 2022-10-27 NOTE — H&P (Signed)
Theresa Lam is a 27 y.o. G5P1011 female at 34w6dby LMP c/w 9wk u/s presenting with reg ctx.  Denies s/s of outbreak. Reports active fetal movement, contractions: regular, every 2 minutes, vaginal bleeding: scant staining, membranes: intact.  [facility]   Most recent u/s : 224w1dEFW [id number] nl, cephalic, ant placenta.   This pregnancy complicated by: # hx HSV # Rh neg # [place] trait  Prenatal History/Complications:  # SVB 200000000ithout complication  Past Medical History: Past Medical History:  Diagnosis Date   HSV-2 infection    Sickle cell trait ([place]    Past Surgical History: Past Surgical History:  Procedure Laterality Date   NO PAST SURGERIES      Obstetrical History: OB History     Gravida  3   Para  1   Term  1   Preterm      AB  1   Living  1      SAB      IAB      Ectopic      Multiple  0   Live Births  1           Social History: Social History   Socioeconomic History   Marital status: Single    Spouse name: Not on file   Number of children: Not on file   Years of education: Not on file   Highest education level: Not on file  Occupational History   Not on file  Tobacco Use   Smoking status: Former    Types: Cigars    Quit date: 2023    Years since quitting: 1.1   Smokeless tobacco: Never  Vaping Use   Vaping Use: Never used  Substance and Sexual Activity   Alcohol use: Not Currently    Comment: last 10/27/21   Drug use: Not Currently    Types: Marijuana    Comment: daily, not since confirmed pregnancy   Sexual activity: Yes    Partners: Male    Birth control/protection: None  Other Topics Concern   Not on file  Social History Narrative   Not on file   Social Determinants of Health   Financial Resource Strain: Not on file  Food Insecurity: No Food Insecurity (08/08/2022)   Hunger Vital Sign    Worried About Running Out of Food in the Last Year: Never true    Ran Out of Food  in the Last Year: Never true  Transportation Needs: No Transportation Needs (08/08/2022)   PRAPARE - TrHydrologistMedical): No    Lack of Transportation (Non-Medical): No  Physical Activity: Not on file  Stress: Not on file  Social Connections: Not on file    Family History: Family History  Problem Relation Age of Onset   Healthy Mother    Cancer Father    Hypertension Maternal Grandmother    Diabetes Paternal Grandfather     Allergies: No Known Allergies  Medications Prior to Admission  Medication Sig Dispense Refill Last Dose   acetaminophen (TYLENOL) 500 MG tablet Take 1,000 mg by mouth every 6 (six) hours as needed for mild pain.   Past Month   ferrous sulfate 325 (65 FE) MG tablet Take 1 tablet (325 mg total) by mouth every other day. Take with a a fruit or other vitamin C rich food. 30 tablet 3 Past Week   Prenatal Vit-Fe Phos-FA-Omega (VITAFOL GUMMIES) 3.33-0.333-34.8 MG CHEW  Chew 3 tablets by mouth daily. 90 tablet 11 10/26/2022   valACYclovir (VALTREX) 500 MG tablet Take 1 tablet (500 mg total) by mouth 2 (two) times daily. 60 tablet 6 10/26/2022   Blood Pressure Monitoring (BLOOD PRESSURE KIT) DEVI 1 kit by Does not apply route once a week. 1 each 0    Misc. Devices (GOJJI WEIGHT SCALE) MISC 1 Device by Does not apply route every 30 (thirty) days. 1 each 0     Review of Systems  Pertinent pos/neg as indicated in HPI  Blood pressure 127/81, pulse 83, temperature 98.3 F (36.8 C), temperature source Oral, resp. rate 18, height 5' 9"$  (1.753 m), weight 101.8 kg, last menstrual period 01/28/2022, unknown if currently breastfeeding. General appearance: alert, cooperative, and mild distress Lungs: clear to auscultation bilaterally Heart: regular rate and rhythm Abdomen: gravid, soft, non-tender, EFW by Leopold's approximately 6-7lbs Extremities: tr edema  Fetal monitoring: FHR: 130s bpm, variability: moderate,  Accelerations: Present,   decelerations:  Absent Uterine activity: Frequency: Every 2 minutes Dilation: 4 Effacement (%): 80 Station: -1 Exam by:: DCallaway, RN Presentation: cephalic   Prenatal labs: ABO, Rh: B/Negative/-- (08/25 1118) Antibody: Negative (08/25 1118) Rubella: 3.56 (08/25 1118) RPR: Non Reactive (11/28 0850)  HBsAg: Negative (08/25 1118)  HIV: Non Reactive (11/28 0850)  GBS: Negative/-- (01/24 1336)  2hr GTT: 76/103/100  Prenatal Transfer Tool  Maternal Diabetes: No Genetic Screening: Normal Maternal Ultrasounds/Referrals: Normal Fetal Ultrasounds or other Referrals:  None Maternal Substance Abuse:  No Significant Maternal Medications:  Meds include: Other: Valtrex Significant Maternal Lab Results: Group B Strep negative and Rh negative  No results found for this or any previous visit (from the past 24 hour(s)).   Assessment:  11w6dSIUP  G3P1011  Latent labor  Cat 1 FHR  GBS Negative/-- (01/24 1336)  Plan:  Admit to L&D  IV pain meds/epidural prn active labor  Expectant management  Anticipate vag del   Plans to breastdfeed  Contraception: Nexplanon outpatient  Circumcision: yes  KMyrtis SerCNM 10/27/2022, 2:07 AM

## 2022-10-27 NOTE — Lactation Note (Signed)
This note was copied from a baby's chart. Lactation Consultation Note  Patient Name: Theresa Lam [id number] Date: 10/27/2022 Reason for consult: Follow-up assessment;Early term 7-38.6wks Age:25 hours P2, ETI female infant, Birth Parent latched infant on her left breast using the football hold, infant latched with depth and was still breastfeeding after 10 minutes when LC left the room. Birth Parent will continue to BF infant by cues, on demand, STS. LC entered the room infant had pacifier in his mouth, [place] educated Birth Parent on not using pacifier due risk of nipple confusion and infant may refuse breast and not show hunger cues. Birth Parent knows to call [place] for further latch assistance if needed.  Maternal Data    Feeding Mother's Current Feeding Choice: Breast Milk  LATCH Score Latch: Grasps breast easily, tongue down, lips flanged, rhythmical sucking.  Audible Swallowing: A few with stimulation  Type of Nipple: Everted at rest and after stimulation  Comfort (Breast/Nipple): Soft / non-tender  Hold (Positioning): Assistance needed to correctly position infant at breast and maintain latch.  LATCH Score: 8   Lactation Tools Discussed/Used    Interventions Interventions: Assisted with latch;Support pillows;Adjust position;Skin to skin;Position options;Breast compression;Breast massage;Education  Discharge    Consult Status Consult Status: Follow-up Date: 10/28/22 Follow-up type: In-patient    Eulis Canner 10/27/2022, 8:49 PM

## 2022-10-27 NOTE — Discharge Summary (Signed)
Postpartum Discharge Summary  Date of Service updated***     Patient Name: Theresa Lam DOB: 10/13/1995 MRN: [id number]  Date of admission: 10/26/2022 Delivery date:10/27/2022  Delivering provider: Serita Grammes D  Date of discharge: 10/27/2022  Admitting diagnosis: Labor and delivery, indication for care [O75.9] Intrauterine pregnancy: [redacted]w[redacted]d    Secondary diagnosis:  Principal Problem:   Labor and delivery, indication for care Active Problems:   History of herpes genitalis   Sickle cell trait in mother affecting pregnancy ([place]   Rh negative state in antepartum period  Additional problems: none    Discharge diagnosis: Term Pregnancy Delivered                                              Post partum procedures:rhogam *** Augmentation:  none Complications: None  Hospital course: Onset of Labor With Vaginal Delivery      27y.o. yo G3P1011 at 396w6das admitted in Latent Labor on 10/26/2022. Labor course was precipitous but otherwise uncomplicated.  Membrane Rupture Time/Date: 3:37 AM ,10/27/2022   Delivery Method:Vaginal, Spontaneous  Episiotomy: None  Lacerations:  None  Patient had a postpartum course complicated by ***.  She is ambulating, tolerating a regular diet, passing flatus, and urinating well. Patient is discharged home in stable condition on 10/27/22.  Newborn Data: Birth date:10/27/2022  Birth time:3:45 AM  Gender:Female  Living status:Living  Apgars:8 ,9  Weight:3147 g (6lb 15oz)  Magnesium Sulfate received: No BMZ received: No Rhophylac:Yes*** MMR:N/A T-DaP:Given prenatally Flu: Yes Transfusion:No  Physical exam  Vitals:   10/27/22 0405 10/27/22 0419 10/27/22 0446 10/27/22 0510  BP: (!) 158/76 (!) 157/76 (!) 123/57 (!) 128/99  Pulse: 80 81 74 68  Resp: 16 16 18 16  $ Temp:      TempSrc:      Weight:      Height:       General: {Exam; general:21111117} Lochia: {Desc; appropriate/inappropriate:30686::"appropriate"} Uterine Fundus: {Desc;  firm/soft:30687} Incision: {Exam; incision:21111123} DVT Evaluation: {Exam; dvt:2111122} Labs: Lab Results  Component Value Date   WBC 7.4 10/27/2022   HGB 10.5 (L) 10/27/2022   HCT 30.8 (L) 10/27/2022   MCV 78.8 (L) 10/27/2022   PLT 180 10/27/2022      Latest Ref Rng & Units 06/13/2016    8:38 PM  CMP  Glucose 65 - 99 mg/dL 108   BUN 6 - 20 mg/dL 11   Creatinine 0.44 - 1.00 mg/dL 0.89   Sodium 135 - 145 mmol/L 135   Potassium 3.5 - 5.1 mmol/L 3.3   Chloride 101 - 111 mmol/L 106   CO2 22 - 32 mmol/L 23   Calcium 8.9 - 10.3 mg/dL 8.8   Total Protein 6.5 - 8.1 g/dL 6.3   Total Bilirubin 0.3 - 1.2 mg/dL 0.8   Alkaline Phos 38 - 126 U/L 61   AST 15 - 41 U/L 16   ALT 14 - 54 U/L 14    Edinburgh Score:     No data to display           After visit meds:  Allergies as of 10/27/2022   No Known Allergies   Med Rec must be completed prior to using this [facility]        Discharge home in stable condition Infant Feeding: {Baby feeding:23562} Infant Disposition:{CHL IP OB HOME WITH MODX:[id number] instruction: per After  Visit Summary and Postpartum booklet. Activity: Advance as tolerated. Pelvic rest for 6 weeks.  Diet: routine diet Future Appointments: Future Appointments  Date Time Provider Department Center  10/30/2022  8:55 AM Minette Brine [facility]  11/07/2022 10:15 AM [facility]  11/07/2022 11:15 AM Caren Macadam, MD [facility]   Follow up Visit:  Myrtis Ser, CNM  P [facility] Admin Pool Please schedule this patient for Postpartum visit in: 6 weeks with the following provider: Any provider In-Person For C/S patients schedule nurse incision check in weeks 2 weeks: no Low risk pregnancy complicated by: none Delivery mode:  SVD Anticipated Birth Control:  Nexplanon PP Procedures needed: Nexplanon placement Schedule Integrated [place] visit: no   10/27/2022 Myrtis Ser, CNM

## 2022-10-27 NOTE — Lactation Note (Signed)
This note was copied from a baby's chart. Lactation Consultation Note  Patient Name: Theresa Lam [id number] Date: 10/27/2022 Age : 25 hours old  Reason for consult: Initial assessment;Early term 37-38.6wks AS LC entered the room , per mom the baby had recently fed 10 mins. Baby still rooting, LC offered to assist and mom receptive.  Baby latched for 7 mins with swallows , depth and per mom comfortable at the breast except cramping. Latch score 8   Maternal Data Has patient been taught Hand Expression?: Yes  Feeding Mother's Current Feeding Choice: Breast Milk  LATCH Score Latch: Grasps breast easily, tongue down, lips flanged, rhythmical sucking.  Audible Swallowing: A few with stimulation  Type of Nipple: Everted at rest and after stimulation  Comfort (Breast/Nipple): Soft / non-tender  Hold (Positioning): Assistance needed to correctly position infant at breast and maintain latch.  LATCH Score: 8   Lactation Tools Discussed/Used    Interventions Interventions: Breast feeding basics reviewed;Assisted with latch;Skin to skin;Hand express;Breast compression;Adjust position;Support pillows;Position options;Education;LC Services brochure  Discharge Pump: Personal;DEBP WIC Program: Yes  Consult Status Consult Status: Follow-up Date: 10/27/22 Follow-up type: In-patient    [place] 10/27/2022, 9:20 AM

## 2022-10-27 NOTE — Anesthesia Procedure Notes (Signed)
Epidural Patient location during procedure: OB Start time: 10/27/2022 2:55 AM End time: 10/27/2022 3:05 AM  Staffing Anesthesiologist: Freddrick March, MD Performed: anesthesiologist   Preanesthetic Checklist Completed: patient identified, IV checked, risks and benefits discussed, monitors and equipment checked, pre-op evaluation and timeout performed  Epidural Patient position: sitting Prep: DuraPrep and site prepped and draped Patient monitoring: continuous pulse ox, blood pressure, heart rate and cardiac monitor Approach: midline Location: L3-L4 Injection technique: LOR air  Needle:  Needle type: Tuohy  Needle gauge: 17 G Needle length: 9 cm Needle insertion depth: 6 cm Catheter type: closed end flexible Catheter size: 19 Gauge Catheter at skin depth: 11 cm Test dose: negative  Assessment Sensory level: T8 Events: blood not aspirated, no cerebrospinal fluid, injection not painful, no injection resistance, no paresthesia and negative IV test  Additional Notes Patient identified. Risks/Benefits/Options discussed with patient including but not limited to bleeding, infection, nerve damage, paralysis, failed block, incomplete pain control, headache, blood pressure changes, nausea, vomiting, reactions to medication both or allergic, itching and postpartum back pain. Confirmed with bedside nurse the patient's most recent platelet count. Confirmed with patient that they are not currently taking any anticoagulation, have any bleeding history or any family history of bleeding disorders. Patient expressed understanding and wished to proceed. All questions were answered. Sterile technique was used throughout the entire procedure. Please see nursing notes for vital signs. Test dose was given through epidural catheter and negative prior to continuing to dose epidural or start infusion. Warning signs of high block given to the patient including shortness of breath, tingling/numbness in hands,  complete motor block, or any concerning symptoms with instructions to call for help. Patient was given instructions on fall risk and not to get out of bed. All questions and concerns addressed with instructions to call with any issues or inadequate analgesia.  Reason for block:procedure for pain

## 2022-10-27 NOTE — MAU Note (Signed)
Pt says UC's strong since 9pm [place]- clinic VE- on Thurs - closed  Has hx of HSV-  no outbreak during this preg- taking Valtrex daily - denies any S/S now GBS- neg

## 2022-10-27 NOTE — Anesthesia Preprocedure Evaluation (Signed)
Anesthesia Evaluation  Patient identified by MRN, date of birth, ID band Patient awake    Reviewed: Allergy & Precautions, NPO status , Patient's Chart, lab work & pertinent test results  Airway Mallampati: II  TM Distance: >3 FB Neck ROM: Full    Dental no notable dental hx.    Pulmonary neg pulmonary ROS, former smoker   Pulmonary exam normal breath sounds clear to auscultation       Cardiovascular negative cardio ROS Normal cardiovascular exam Rhythm:Regular Rate:Normal     Neuro/Psych negative neurological ROS  negative psych ROS   GI/Hepatic negative GI ROS, Neg liver ROS,,,  Endo/Other  negative endocrine ROS    Renal/GU negative Renal ROS  negative genitourinary   Musculoskeletal negative musculoskeletal ROS (+)    Abdominal   Peds  Hematology  (+) Blood dyscrasia, Sickle cell trait and anemia   Anesthesia Other Findings Presents in labor  Reproductive/Obstetrics (+) Pregnancy                             Anesthesia Physical Anesthesia Plan  ASA: 2  Anesthesia Plan: Epidural   Post-op Pain Management:    Induction:   PONV Risk Score and Plan: Treatment may vary due to age or medical condition  Airway Management Planned: Natural Airway  Additional Equipment:   Intra-op Plan:   Post-operative Plan:   Informed Consent: I have reviewed the patients History and Physical, chart, labs and discussed the procedure including the risks, benefits and alternatives for the proposed anesthesia with the patient or authorized representative who has indicated his/her understanding and acceptance.       Plan Discussed with: Anesthesiologist  Anesthesia Plan Comments: (Patient identified. Risks, benefits, options discussed with patient including but not limited to bleeding, infection, nerve damage, paralysis, failed block, incomplete pain control, headache, blood pressure changes,  nausea, vomiting, reactions to medication, itching, and post partum back pain. Confirmed with bedside nurse the patient's most recent platelet count. Confirmed with the patient that they are not taking any anticoagulation, have any bleeding history or any family history of bleeding disorders. Patient expressed understanding and wishes to proceed. All questions were answered. )       Anesthesia Quick Evaluation

## 2022-10-27 NOTE — Anesthesia Postprocedure Evaluation (Signed)
Anesthesia Post Note  Patient: Theresa Lam  Procedure(s) Performed: AN AD HOC LABOR EPIDURAL     Patient location during evaluation: Mother Baby Anesthesia Type: Epidural Level of consciousness: awake and alert Pain management: pain level controlled Vital Signs Assessment: post-procedure vital signs reviewed and stable Respiratory status: spontaneous breathing, nonlabored ventilation and respiratory function stable Cardiovascular status: stable Postop Assessment: no headache, no backache and epidural receding Anesthetic complications: no   No notable events documented.  Last Vitals:  Vitals:   10/27/22 0830 10/27/22 1230  BP: 117/73 133/82  Pulse: 89 61  Resp: 16 18  Temp: 36.9 C 37.1 C  SpO2: 100% 100%    Last Pain:  Vitals:   10/27/22 1230  TempSrc: Oral  PainSc:    Pain Goal:                   Rayvon Char

## 2022-10-27 NOTE — Plan of Care (Signed)
  Problem: Education: Goal: Knowledge of General Education information will improve Description: Including pain rating scale, medication(s)/side effects and non-pharmacologic comfort measures Outcome: Progressing   Problem: Health Behavior/Discharge Planning: Goal: Ability to manage health-related needs will improve Outcome: Progressing   Problem: Clinical Measurements: Goal: Ability to maintain clinical measurements within normal limits will improve Outcome: Progressing Goal: Will remain free from infection Outcome: Progressing Goal: Diagnostic test results will improve Outcome: Progressing Goal: Respiratory complications will improve Outcome: Progressing Goal: Cardiovascular complication will be avoided Outcome: Progressing   Problem: Activity: Goal: Risk for activity intolerance will decrease Outcome: Progressing   Problem: Nutrition: Goal: Adequate nutrition will be maintained Outcome: Progressing   Problem: Coping: Goal: Level of anxiety will decrease Outcome: Progressing   Problem: Elimination: Goal: Will not experience complications related to bowel motility Outcome: Progressing Goal: Will not experience complications related to urinary retention Outcome: Progressing   Problem: Pain Managment: Goal: General experience of comfort will improve Outcome: Progressing   Problem: Education: Goal: Knowledge of Childbirth will improve Outcome: Completed/Met Goal: Ability to make informed decisions regarding treatment and plan of care will improve Outcome: Completed/Met Goal: Ability to state and carry out methods to decrease the pain will improve Outcome: Completed/Met Goal: Individualized Educational Video(s) Outcome: Completed/Met   Problem: Coping: Goal: Ability to verbalize concerns and feelings about labor and delivery will improve Outcome: Completed/Met   Problem: Life Cycle: Goal: Ability to make normal progression through stages of labor will  improve Outcome: Completed/Met Goal: Ability to effectively push during vaginal delivery will improve Outcome: Completed/Met   Problem: Role Relationship: Goal: Will demonstrate positive interactions with the child Outcome: Completed/Met   Problem: Safety: Goal: Risk of complications during labor and delivery will decrease Outcome: Completed/Met   Problem: Pain Management: Goal: Relief or control of pain from uterine contractions will improve Outcome: Completed/Met   Problem: Safety: Goal: Ability to remain free from injury will improve Outcome: Completed/Met   Problem: Skin Integrity: Goal: Risk for impaired skin integrity will decrease Outcome: Completed/Met   Problem: Education: Goal: Knowledge of condition will improve Outcome: Completed/Met Goal: Individualized Educational Video(s) Outcome: Completed/Met Goal: Individualized Newborn Educational Video(s) Outcome: Completed/Met   Problem: Activity: Goal: Will verbalize the importance of balancing activity with adequate rest periods Outcome: Completed/Met Goal: Ability to tolerate increased activity will improve Outcome: Completed/Met   Problem: Coping: Goal: Ability to identify and utilize available resources and services will improve Outcome: Completed/Met   Problem: Life Cycle: Goal: Chance of risk for complications during the postpartum period will decrease Outcome: Completed/Met   Problem: Role Relationship: Goal: Ability to demonstrate positive interaction with newborn will improve Outcome: Completed/Met   Problem: Skin Integrity: Goal: Demonstration of wound healing without infection will improve Outcome: Completed/Met

## 2022-10-27 NOTE — Progress Notes (Signed)
Epidural Timeout: [id number] Clean: 0256 Injection: [id number] Drape: [id number] Start: [id number] First Dose: 0302 Catheter: 0303

## 2022-10-28 LAB — RH IG WORKUP (INCLUDES ABO/RH)
Fetal Screen: NEGATIVE
Gestational Age(Wks): 38
Unit division: 0

## 2022-10-28 MED ORDER — BENZOCAINE-MENTHOL 20-0.5 % EX AERO: 1.0000 | INHALATION_SPRAY | CUTANEOUS | 0 refills | Status: DC | PRN

## 2022-10-28 NOTE — Progress Notes (Signed)
Circumcision Consent  Discussed with mom at bedside about circumcision.   Circumcision is a surgery that removes the skin that covers the tip of the penis, called the "foreskin." Circumcision is usually done when a boy is between 62 and 31 days old, sometimes up to 76-8 weeks old.  The most common reasons boys are circumcised include for cultural/religious beliefs or for parental preference (potentially easier to clean, so baby looks like daddy, etc).  There may be some medical benefits for circumcision:   Circumcised boys seem to have slightly lower rates of: ? Urinary tract infections (per the American Academy of Pediatrics an uncircumcised boy has a 1/100 chance of developing a UTI in the first year of life, a circumcised boy at a 09/998 chance of developing a UTI in the first year of life- a 10% reduction) ? Penis cancer (typically rare- an uncircumcised female has a 1 in 100,000 chance of developing cancer of the penis) ? Sexually transmitted infection (in endemic areas, including HIV, HPV and Herpes- circumcision does NOT protect against gonorrhea, chlamydia, trachomatis, or syphilis) ? Phimosis: a condition where that makes retraction of the foreskin over the glans impossible (0.4 per 1000 boys per year or 0.6% of boys are affected by their 15th birthday)  Boys and men who are not circumcised can reduce these extra risks by: ? Cleaning their penis well ? Using condoms during sex  What are the risks of circumcision?  As with any surgical procedure, there are risks and complications. In circumcision, complications are rare and usually minor, the most common being: ? Bleeding- risk is reduced by holding each clamp for 30 seconds prior to a cut being made, and by holding pressure after the procedure is done ? Infection- the penis is cleaned prior to the procedure, and the procedure is done under sterile technique ? Damage to the urethra or amputation of the penis  How is circumcision done  in baby boys?  The baby will be placed on a special table and the legs restrained for their safety. Numbing medication is injected into the penis, and the skin is cleansed with betadine to decrease the risk of infection.   What to expect:  The penis will look red and raw for 5-7 days as it heals. We expect scabbing around where the cut was made, as well as clear-pink fluid and some swelling of the penis right after the procedure. If your baby's circumcision starts to bleed or develops pus, please contact your pediatrician immediately.  All questions were answered and mother consented.  Theresa Living, MD 7:56 AM

## 2022-10-28 NOTE — Lactation Note (Signed)
This note was copied from a baby's chart. Lactation Consultation Note  Patient Name: Theresa Lam [id number] Date: 10/28/2022 Reason for consult: Follow-up assessment;Term;Early term 37-38.6wks Age:25 hours  LC in to room, lactating parent is pumping and expecting discharge soon. Lactating parent is breast and formula feeding and reports feeding 5-mL of formula. Lactating parent reports pumping but collecting very little. Reviewed pumping basics and fitted flange 24-mm to right breast and 27-mm to left breast. Observed drops of milk upon fitting. Parent has volume guidelines for formula feeding. Discussed normal behavior and patterns after 24h, voids and stools as signs good intake, pumping, clusterfeeding, skin to skin. Talked about milk coming into volume and managing engorgement.   Plan: 1-Feeding on demand or 8-12 times in 24h period. 2-Hand express/pump as needed for supplementation 3-Encouraged birthing parent rest, hydration and food intake.   Contact LC as needed for feeds/support/concerns/questions. All questions answered at this time. Reviewed [facility] brochure and additional resources available upon discharge. .     Maternal Data Has patient been taught Hand Expression?: Yes  Feeding Mother's Current Feeding Choice: Breast Milk and Formula  LATCH Score No latch observed during this encounter, infant is getting circumcised   Lactation Tools Discussed/Used Tools: Pump;Flanges Flange Size: 27;24 (R-57m; L-2110m Breast pump type: Manual;Double-Electric Breast Pump Pump Education: Milk Storage;Setup, frequency, and cleaning Reason for Pumping: stimulation and supplementation Pumping frequency: at least every3h Pumped volume:  (drops with changing flange size)  Interventions Interventions: Breast feeding basics reviewed;Skin to skin;Hand express;Breast massage;Hand pump;Expressed milk;Coconut oil;Education;DEBP;LC Services brochure  Discharge Discharge Education:  Engorgement and breast care;Warning signs for feeding baby Pump: Personal;Manual;DEBP WIC Program: Yes  Consult Status Consult Status: Complete Date: 10/28/22 Follow-up type: Call as needed    LiRagland/07/2023, 12:04 PM

## 2022-10-30 ENCOUNTER — Encounter: Payer: Medicaid Other | Admitting: Advanced Practice Midwife

## 2022-10-31 ENCOUNTER — Encounter: Payer: Self-pay | Admitting: Family Medicine

## 2022-11-01 ENCOUNTER — Other Ambulatory Visit: Payer: Self-pay

## 2022-11-01 ENCOUNTER — Inpatient Hospital Stay (HOSPITAL_COMMUNITY): Payer: Medicaid Other

## 2022-11-01 ENCOUNTER — Inpatient Hospital Stay (HOSPITAL_COMMUNITY)
Admission: AD | Admit: 2022-11-01 | Discharge: 2022-11-03 | DRG: 776 | Disposition: A | Discharge status: Home / Self Care | Payer: Medicaid Other | Attending: Obstetrics and Gynecology | Admitting: Obstetrics and Gynecology

## 2022-11-01 ENCOUNTER — Encounter (HOSPITAL_COMMUNITY): Payer: Self-pay | Admitting: Obstetrics and Gynecology

## 2022-11-01 DIAGNOSIS — Z79899 Other long term (current) drug therapy: Secondary | ICD-10-CM

## 2022-11-01 DIAGNOSIS — O1495 Unspecified pre-eclampsia, complicating the puerperium: Principal | ICD-10-CM | POA: Diagnosis present

## 2022-11-01 DIAGNOSIS — O1415 Severe pre-eclampsia, complicating the puerperium: Principal | ICD-10-CM | POA: Diagnosis present

## 2022-11-01 DIAGNOSIS — O9903 Anemia complicating the puerperium: Secondary | ICD-10-CM | POA: Diagnosis present

## 2022-11-01 DIAGNOSIS — Z6791 Unspecified blood type, Rh negative: Secondary | ICD-10-CM

## 2022-11-01 DIAGNOSIS — D573 Sickle-cell trait: Secondary | ICD-10-CM | POA: Diagnosis present

## 2022-11-01 DIAGNOSIS — Z87891 Personal history of nicotine dependence: Secondary | ICD-10-CM

## 2022-11-01 DIAGNOSIS — Z20822 Contact with and (suspected) exposure to covid-19: Secondary | ICD-10-CM | POA: Diagnosis present

## 2022-11-01 LAB — CBC
HCT: 30 % — ABNORMAL LOW (ref 36.0–46.0)
Hemoglobin: 10.3 g/dL — ABNORMAL LOW (ref 12.0–15.0)
MCH: 27.1 pg (ref 26.0–34.0)
MCHC: 34.3 g/dL (ref 30.0–36.0)
MCV: 78.9 fL — ABNORMAL LOW (ref 80.0–100.0)
Platelets: 203 10*3/uL (ref 150–400)
RBC: 3.8 MIL/uL — ABNORMAL LOW (ref 3.87–5.11)
RDW: 15.4 % (ref 11.5–15.5)
WBC: 5.9 10*3/uL (ref 4.0–10.5)
nRBC: 0.7 % — ABNORMAL HIGH (ref 0.0–0.2)

## 2022-11-01 LAB — COMPREHENSIVE METABOLIC PANEL
ALT: 211 U/L — ABNORMAL HIGH (ref 0–44)
AST: 138 U/L — ABNORMAL HIGH (ref 15–41)
Albumin: 2.6 g/dL — ABNORMAL LOW (ref 3.5–5.0)
Alkaline Phosphatase: 129 U/L — ABNORMAL HIGH (ref 38–126)
Anion gap: 7 (ref 5–15)
BUN: 10 mg/dL (ref 6–20)
CO2: 22 mmol/L (ref 22–32)
Calcium: 8.5 mg/dL — ABNORMAL LOW (ref 8.9–10.3)
Chloride: 108 mmol/L (ref 98–111)
Creatinine, Ser: 0.82 mg/dL (ref 0.44–1.00)
GFR, Estimated: >60 mL/min (ref >60)
Glucose, Bld: 88 mg/dL (ref 70–99)
Potassium: 4.2 mmol/L (ref 3.5–5.1)
Sodium: 137 mmol/L (ref 135–145)
Total Bilirubin: 0.4 mg/dL (ref 0.3–1.2)
Total Protein: 5.9 g/dL — ABNORMAL LOW (ref 6.5–8.1)

## 2022-11-01 LAB — TROPONIN I (HIGH SENSITIVITY): Troponin I (High Sensitivity): 9 ng/L (ref <18)

## 2022-11-01 LAB — RESP PANEL BY RT-PCR (RSV, FLU A&B, COVID)  RVPGX2
Influenza A by PCR: NEGATIVE
Influenza B by PCR: NEGATIVE
Resp Syncytial Virus by PCR: NEGATIVE
SARS Coronavirus 2 by RT PCR: NEGATIVE

## 2022-11-01 LAB — BRAIN NATRIURETIC PEPTIDE: B Natriuretic Peptide: 621.2 pg/mL — ABNORMAL HIGH (ref 0.0–100.0)

## 2022-11-01 LAB — D-DIMER, QUANTITATIVE: D-Dimer, Quant: 14.51 ug/mL-FEU — ABNORMAL HIGH (ref 0.00–0.50)

## 2022-11-01 MED ORDER — IBUPROFEN 600 MG PO TABS: 600.0000 mg | ORAL_TABLET | Freq: Four times a day (QID) | ORAL | Status: DC | PRN

## 2022-11-01 MED ORDER — MAGNESIUM SULFATE 40 GM/1000ML IV SOLN
2.0000 g/h | INTRAVENOUS | Status: AC
  Administered 2022-11-01 – 2022-11-02 (×2): 2 g/h via INTRAVENOUS
  Filled 2022-11-01 (×2): qty 1000

## 2022-11-01 MED ORDER — LACTATED RINGERS IV SOLN: INTRAVENOUS | Status: DC

## 2022-11-01 MED ORDER — DOCUSATE SODIUM 100 MG PO CAPS
100.0000 mg | ORAL_CAPSULE | Freq: Every day | ORAL | Status: DC
  Administered 2022-11-02 – 2022-11-03 (×2): 100 mg via ORAL
  Filled 2022-11-01 (×2): qty 1

## 2022-11-01 MED ORDER — NIFEDIPINE 10 MG PO CAPS
10.0000 mg | ORAL_CAPSULE | ORAL | Status: DC | PRN
  Administered 2022-11-01: 10 mg via ORAL
  Filled 2022-11-01: qty 1

## 2022-11-01 MED ORDER — ACETAMINOPHEN 325 MG PO TABS: 650.0000 mg | ORAL_TABLET | ORAL | Status: DC | PRN

## 2022-11-01 MED ORDER — FUROSEMIDE 20 MG PO TABS
20.0000 mg | ORAL_TABLET | Freq: Every day | ORAL | Status: DC
  Administered 2022-11-01: 20 mg via ORAL
  Filled 2022-11-01: qty 1

## 2022-11-01 MED ORDER — PRENATAL MULTIVITAMIN CH
1.0000 | ORAL_TABLET | Freq: Every day | ORAL | Status: DC
  Administered 2022-11-02 – 2022-11-03 (×2): 1 via ORAL
  Filled 2022-11-01 (×2): qty 1

## 2022-11-01 MED ORDER — NIFEDIPINE 10 MG PO CAPS: 20.0000 mg | ORAL_CAPSULE | ORAL | Status: DC | PRN

## 2022-11-01 MED ORDER — NIFEDIPINE ER OSMOTIC RELEASE 30 MG PO TB24
30.0000 mg | ORAL_TABLET | Freq: Every day | ORAL | Status: DC
  Administered 2022-11-01 – 2022-11-03 (×3): 30 mg via ORAL
  Filled 2022-11-01 (×3): qty 1

## 2022-11-01 MED ORDER — MAGNESIUM SULFATE BOLUS VIA INFUSION
4.0000 g | Freq: Once | INTRAVENOUS | Status: AC
  Administered 2022-11-01: 4 g via INTRAVENOUS
  Filled 2022-11-01: qty 1000

## 2022-11-01 MED ORDER — ALUM & MAG HYDROXIDE-SIMETH 200-200-20 MG/5ML PO SUSP
30.0000 mL | Freq: Once | ORAL | Status: AC
  Administered 2022-11-01: 30 mL via ORAL
  Filled 2022-11-01: qty 30

## 2022-11-01 MED ORDER — CALCIUM CARBONATE ANTACID 500 MG PO CHEW: 2.0000 | CHEWABLE_TABLET | ORAL | Status: DC | PRN

## 2022-11-01 MED ORDER — FERROUS SULFATE 325 (65 FE) MG PO TABS
325.0000 mg | ORAL_TABLET | Freq: Every day | ORAL | Status: DC
  Administered 2022-11-02 – 2022-11-03 (×2): 325 mg via ORAL
  Filled 2022-11-01 (×2): qty 1

## 2022-11-01 MED ORDER — IBUPROFEN 600 MG PO TABS
600.0000 mg | ORAL_TABLET | Freq: Four times a day (QID) | ORAL | Status: DC | PRN
  Administered 2022-11-01 – 2022-11-02 (×4): 600 mg via ORAL
  Filled 2022-11-01 (×4): qty 1

## 2022-11-01 MED ORDER — FUROSEMIDE 20 MG PO TABS
20.0000 mg | ORAL_TABLET | Freq: Two times a day (BID) | ORAL | Status: DC
  Administered 2022-11-02 – 2022-11-03 (×3): 20 mg via ORAL
  Filled 2022-11-01 (×3): qty 1

## 2022-11-01 MED ORDER — LIDOCAINE VISCOUS HCL 2 % MT SOLN
15.0000 mL | Freq: Once | OROMUCOSAL | Status: AC
  Administered 2022-11-01: 15 mL via ORAL
  Filled 2022-11-01: qty 15

## 2022-11-01 MED ORDER — FUROSEMIDE 10 MG/ML IJ SOLN
20.0000 mg | Freq: Once | INTRAMUSCULAR | Status: AC
  Administered 2022-11-01: 20 mg via INTRAVENOUS
  Filled 2022-11-01: qty 2

## 2022-11-01 MED ORDER — CYCLOBENZAPRINE HCL 5 MG PO TABS
10.0000 mg | ORAL_TABLET | Freq: Once | ORAL | Status: AC
  Administered 2022-11-01: 10 mg via ORAL
  Filled 2022-11-01: qty 2

## 2022-11-01 NOTE — MAU Note (Signed)
.  Theresa Lam is a 27 y.o. at Unknown here in MAU reporting: PP vag 2/10.  Started having SOB and mid sternal chest discomfort yesterday.  It is hard for her to walk across the room with out stopping to catch her breath. Has had a cough but no upper respiratory symptoms. Stated her felt and hands are more swollen. Took b/p at home and it  150/? Denies headache or visual changes.   Onset of complaint: yesterday Pain score: 5 Vitals:   11/01/22 1611  BP: (!) 157/88  Pulse: 67  Resp: 20  Temp: 99.4 F (37.4 C)  SpO2: 97%      Lab orders placed from triage:

## 2022-11-01 NOTE — MAU Provider Note (Signed)
History     CSN: [id number]  Arrival date and time: 11/01/22 1554   Event Date/Time   First Provider Initiated Contact with Patient 11/01/22 1649      Chief Complaint  Patient presents with   Shortness of Breath   Theresa Lam is a 27 y.o. female G3P2 postpartum day 5 of vaginal birth presenting with shortness of breath and chest discomfort. Her symptoms started 2-3 days ago and have been constant since. She says her SOB occurs when she is up moving around and resolves at rest. Sometimes the shortness of breath causes her to cough to catch her breath but she does not have any sputum. Associated chest pain that she describes as discomfort that has lingered in the middle of her chest. Patient pointed to the substernal area of her chest. Pain does not radiate to shoulder or back. She has tried Tylenol extra strength with no relief. Physical activity makes her symptoms worse. Also complaining of swelling in her hands, calves and ankles. She says she had some swelling in her pregnancy but this is much worse. She reports no history of high blood pressure or complications with her pregnancy and delivery. She denies symptoms of weight changes, fever, chills, orthopnea, and abdominal pain. She has been feeling more fatigued due to disrupted sleep with her newborn.       OB History     Gravida  3   Para  2   Term  2   Preterm      AB  1   Living  2      SAB      IAB      Ectopic      Multiple  0   Live Births  2           Past Medical History:  Diagnosis Date   HSV-2 infection    Sickle cell trait (HCC)     Past Surgical History:  Procedure Laterality Date   NO PAST SURGERIES      Family History  Problem Relation Age of Onset   Healthy Mother    Cancer Father    Hypertension Maternal Grandmother    Diabetes Paternal Grandfather     Social History   Tobacco Use   Smoking status: Former    Types: Cigars    Quit date: 2023    Years since  quitting: 1.1   Smokeless tobacco: Never  Vaping Use   Vaping Use: Never used  Substance Use Topics   Alcohol use: Not Currently    Comment: last 10/27/21   Drug use: Not Currently    Types: Marijuana    Comment: daily, not since confirmed pregnancy    Allergies: No Known Allergies  Medications Prior to Admission  Medication Sig Dispense Refill Last Dose   acetaminophen (TYLENOL) 500 MG tablet Take 1,000 mg by mouth every 6 (six) hours as needed for mild pain.      benzocaine-Menthol (DERMOPLAST) 20-0.5 % AERO Apply 1 Application topically as needed for irritation (perineal discomfort). 78 g 0    Blood Pressure Monitoring (BLOOD PRESSURE KIT) DEVI 1 kit by Does not apply route once a week. 1 each 0    ferrous sulfate 325 (65 FE) MG tablet Take 1 tablet (325 mg total) by mouth every other day. Take with a a fruit or other vitamin C rich food. (Patient taking differently: Take 325 mg by mouth See admin instructions. Take 325 mg by mouth every other day with a  a fruit or other vitamin C-rich food) 30 tablet 3    Misc. Devices (GOJJI WEIGHT SCALE) MISC 1 Device by Does not apply route every 30 (thirty) days. 1 each 0    Prenatal Vit-Fe Phos-FA-Omega (VITAFOL GUMMIES) 3.33-0.333-34.8 MG CHEW Chew 3 tablets by mouth daily. 90 tablet 11     Review of Systems  Constitutional:  Positive for fatigue. Negative for chills and fever.  HENT:  Negative for congestion, rhinorrhea and sore throat.   Respiratory:  Positive for cough and shortness of breath.   Cardiovascular:  Positive for chest pain and leg swelling.  Gastrointestinal:  Negative for abdominal pain, nausea and vomiting.  Neurological:  Negative for dizziness and headaches.  Psychiatric/Behavioral:  Positive for sleep disturbance.    Physical Exam   Blood pressure (!) 146/90, pulse (!) 56, temperature 99.4 F (37.4 C), resp. rate 20, height 5' 9"$  (1.753 m), weight 102.1 kg, SpO2 98 %, currently breastfeeding. Patient Vitals for the  past 24 hrs:  BP Temp Temp src Pulse Resp SpO2 Height Weight  11/01/22 1851 (!) 146/93 99.1 F (37.3 C) Oral 73 18 96 % -- --  11/01/22 1758 135/78 -- -- (!) 114 -- -- -- --  11/01/22 1732 (!) 166/92 -- -- -- -- -- -- --  11/01/22 1730 (!) 166/92 -- -- (!) 57 -- -- -- --  11/01/22 1715 (!) 155/95 -- -- (!) 58 -- -- -- --  11/01/22 1700 (!) 146/90 -- -- (!) 56 -- 98 % -- --  11/01/22 1651 (!) 146/92 -- -- (!) 58 -- -- -- --  11/01/22 1628 (!) 165/85 -- -- (!) 58 -- -- -- --  11/01/22 1611 (!) 157/88 99.4 F (37.4 C) -- 67 20 97 % 5' 9"$  (1.753 m) 102.1 kg     Physical Exam Vitals reviewed.  Constitutional:      General: She is not in acute distress.    Appearance: She is well-developed.  HENT:     Head: Normocephalic and atraumatic.  Cardiovascular:     Rate and Rhythm: Normal rate and regular rhythm.  Pulmonary:     Breath sounds: Normal breath sounds. No wheezing, rhonchi or rales.  Chest:     Chest wall: No tenderness.  Abdominal:     Tenderness: There is no abdominal tenderness.  Musculoskeletal:     Right lower leg: Edema present.     Left lower leg: Edema present.  Neurological:     Mental Status: She is alert and oriented to person, place, and time.  Psychiatric:        Mood and Affect: Mood normal.     MAU Course  Procedures Results for orders placed or performed during the hospital encounter of 11/01/22 (from the past 24 hour(s))  Comprehensive metabolic panel     Status: Abnormal   Collection Time: 11/01/22  4:44 PM  Result Value Ref Range   Sodium 137 135 - 145 mmol/L   Potassium 4.2 3.5 - 5.1 mmol/L   Chloride 108 98 - 111 mmol/L   CO2 22 22 - 32 mmol/L   Glucose, Bld 88 70 - 99 mg/dL   BUN 10 6 - 20 mg/dL   Creatinine, Ser 0.82 0.44 - 1.00 mg/dL   Calcium 8.5 (L) 8.9 - 10.3 mg/dL   Total Protein 5.9 (L) 6.5 - 8.1 g/dL   Albumin 2.6 (L) 3.5 - 5.0 g/dL   AST 138 (H) 15 - 41 U/L   ALT 211 (H) 0 -  44 U/L   Alkaline Phosphatase 129 (H) 38 - 126 U/L    Total Bilirubin 0.4 0.3 - 1.2 mg/dL   GFR, Estimated >60 >60 mL/min   Anion gap 7 5 - 15  CBC     Status: Abnormal   Collection Time: 11/01/22  4:44 PM  Result Value Ref Range   WBC 5.9 4.0 - 10.5 K/uL   RBC 3.80 (L) 3.87 - 5.11 MIL/uL   Hemoglobin 10.3 (L) 12.0 - 15.0 g/dL   HCT 30.0 (L) 36.0 - 46.0 %   MCV 78.9 (L) 80.0 - 100.0 fL   MCH 27.1 26.0 - 34.0 pg   MCHC 34.3 30.0 - 36.0 g/dL   RDW 15.4 11.5 - 15.5 %   Platelets 203 150 - 400 K/uL   nRBC 0.7 (H) 0.0 - 0.2 %  Troponin I (High Sensitivity)     Status: None   Collection Time: 11/01/22  4:44 PM  Result Value Ref Range   Troponin I (High Sensitivity) 9 <18 ng/L  D-dimer, quantitative     Status: Abnormal   Collection Time: 11/01/22  4:44 PM  Result Value Ref Range   D-Dimer, Quant 14.51 (H) 0.00 - 0.50 ug/mL-FEU  Brain natriuretic peptide     Status: Abnormal   Collection Time: 11/01/22  4:48 PM  Result Value Ref Range   B Natriuretic Peptide 621.2 (H) 0.0 - 100.0 pg/mL   DG Chest 2 View  Result Date: 11/01/2022 CLINICAL DATA:  Postpartum hypertension short of breath EXAM: CHEST - 2 VIEW COMPARISON:  06/14/2016 FINDINGS: Trace pleural effusions. Vascular congestion and mild interstitial edema. Normal cardiac size. No consolidation or pneumothorax IMPRESSION: Vascular congestion and mild interstitial edema with small pleural effusions. Electronically Signed   By: Donavan Foil M.D.   On: 11/01/2022 18:27      MDM Nifedipine capsule 10 mg    Assessment and Plan  Admit   [place] November 11/01/2022, 5:17 PM     Attestation of Supervision of Student:  I confirm that I have verified the information documented in the physician assistant student's note and that I have also personally performed the history, physical exam and all medical decision making activities.  I have verified that all services and findings are accurately documented in this student's note; and I agree with management and plan as outlined in the  documentation. I have also made any necessary editorial changes.  History Theresa Lam is a 27 y.o. [id number] at 5 days post partum who presents for chest pain & shortness of breath. She had a term vaginal delivery on 2/10. Denies complications with pregnancy or history of hypertension.  Current symptoms started a few days ago. Reports constant substernal chest discomfort. No aggravating factors. Also reports intermittent shortness of breath. Feels like she wakes up gasping for air. Has had recent non productive cough. Tylenol has not helped with symptoms. Denies fever, sick contacts, headache, body aches, epigastric pain, abdominal pain.   Physical exam Temp:  [99.1 F (37.3 C)-99.4 F (37.4 C)] 99.1 F (37.3 C) (02/15 1851) Pulse Rate:  [56-114] 73 (02/15 1851) Resp:  [18-20] 18 (02/15 1851) BP: (135-166)/(78-95) 146/93 (02/15 1851) SpO2:  [96 %-98 %] 96 % (02/15 1851) Weight:  [102.1 kg] 102.1 kg (02/15 1611)  Physical Examination: General appearance - alert, well appearing, and in no distress Mental status - normal mood, behavior, speech, dress, motor activity, and thought processes Eyes - pupils equal and reactive, extraocular eye movements intact, sclera anicteric Chest -  clear to auscultation, no wheezes, rales or rhonchi, symmetric air entry Neurological - DTR's normal and symmetric, no clonus Extremities - pedal edema 2 + bilaterally  MDM Severe range BPs treated with oral procardia with good response GI cocktail & flexeril given without relief  EKG reviewed by Dr. Clement Husbands; normal  Consulted with Dr. Mikel Cella - will admit for severe pospartum preeclampsia  Assessment/Plan Postpartum preeclampsia -Admit to OBSC unit -Procardia xl 30 daily -Lasix 20 mg daily x 5 days -Mag sulfate 4/2   Jorje Guild, NP 11/01/2022 7:01 PM

## 2022-11-01 NOTE — H&P (Signed)
FACULTY PRACTICE ANTEPARTUM ADMISSION HISTORY AND PHYSICAL NOTE  History of Present Illness: Theresa Lam is a 27 y.o. [id number] PPD5 s/p SVD being readmitted for postpartum preeclampsia w/ SF (BP, LFTs).   Pt initially presented with 2-3 days of SOB & substernal chest pain. She says her SOB occurs when she is up moving around and resolves at rest. Sometimes the shortness of breath causes her to cough to catch her breath but she does not have any sputum. Associated chest pain that she describes as discomfort that has lingered in the middle of her chest. Patient pointed to the substernal area of her chest. Pain does not radiate to shoulder or back. She has tried Tylenol extra strength with no relief. Physical activity makes her symptoms worse. Also complaining of swelling in her hands, calves and ankles. She says she had some swelling in her pregnancy but this is much worse. She reports no history of high blood pressure or complications with her pregnancy and delivery. She denies symptoms of weight changes, fever, chills, orthopnea, and abdominal pain. She has been feeling more fatigued due to disrupted sleep with her newborn.    In MAU, pt's BP has been 130-150s/70-90s w/ HR 50-60s, SpO2 96-98% on RA & no fever. Labs: 5.9>10.3<203, Cr 0.82, ALT 2112, AST 138, Tbili 0.4, BNP 621.2, trop 9, d dimer 14.51, negative flu/covid/RSV CXR w/ vascular congestion and mild interstitial edema with small pleural effusions. She received PO nifed x 1 with improvement in her Bps as well as a GI cocktail & flexeril with significant improvement in her chest pain.   Patient Active Problem List   Diagnosis Date Noted   Preeclampsia in postpartum period 11/01/2022   Labor and delivery, indication for care 10/27/2022   Rh negative state in antepartum period 07/30/2022   Trichomonas vaginalis (TV) infection 05/14/2022   History of herpes genitalis 05/11/2022   Sickle cell trait in mother affecting pregnancy ([place])  05/11/2022   Supervision of other normal pregnancy, antepartum 04/05/2022   Past Medical History:  Diagnosis Date   HSV-2 infection    Sickle cell trait ([place])    Past Surgical History:  Procedure Laterality Date   NO PAST SURGERIES     OB History  Gravida Para Term Preterm AB Living  3 2 2   1 2  $ SAB IAB Ectopic Multiple Live Births        0 2    # Outcome Date GA Lbr Len/2nd Weight Sex Delivery Anes PTL Lv  3 Term 10/27/22 [id number]:37 / 00:08 3147 g M Vag-Spont EPI  LIV  2 AB 11/2021 830w0d  TAB     1 Term 03/31/16 [id number]:19 / 00:54 3525 g F Vag-Spont Local  LIV     Birth Comments: Hgb, Normal, FA Newborn Screen Barcode: [id number] Collected: 04/01/2016   Social History   Socioeconomic History   Marital status: Single    Spouse name: Not on file   Number of children: Not on file   Years of education: Not on file   Highest education level: Not on file  Occupational History   Not on file  Tobacco Use   Smoking status: Former    Types: Cigars    Quit date: 2023    Years since quitting: 1.1   Smokeless tobacco: Never  Vaping Use   Vaping Use: Never used  Substance and Sexual Activity   Alcohol use: Not Currently    Comment: last 10/27/21   Drug  use: Not Currently    Types: Marijuana    Comment: daily, not since confirmed pregnancy   Sexual activity: Yes    Partners: Male    Birth control/protection: None  Other Topics Concern   Not on file  Social History Narrative   Not on file   Social Determinants of Health   Financial Resource Strain: Not on file  Food Insecurity: No Food Insecurity (11/01/2022)   Hunger Vital Sign    Worried About Running Out of Food in the Last Year: Never true    Ran Out of Food in the Last Year: Never true  Transportation Needs: No Transportation Needs (11/01/2022)   PRAPARE - Hydrologist (Medical): No    Lack of Transportation (Non-Medical): No  Physical Activity: Not on file  Stress: Not on  file  Social Connections: Not on file   Family History  Problem Relation Age of Onset   Healthy Mother    Cancer Father    Hypertension Maternal Grandmother    Diabetes Paternal Grandfather    No Known Allergies  Medications Prior to Admission  Medication Sig Dispense Refill Last Dose   acetaminophen (TYLENOL) 500 MG tablet Take 1,000 mg by mouth every 6 (six) hours as needed for mild pain.      benzocaine-Menthol (DERMOPLAST) 20-0.5 % AERO Apply 1 Application topically as needed for irritation (perineal discomfort). 78 g 0    Blood Pressure Monitoring (BLOOD PRESSURE KIT) DEVI 1 kit by Does not apply route once a week. 1 each 0    ferrous sulfate 325 (65 FE) MG tablet Take 1 tablet (325 mg total) by mouth every other day. Take with a a fruit or other vitamin C rich food. (Patient taking differently: Take 325 mg by mouth See admin instructions. Take 325 mg by mouth every other day with a a fruit or other vitamin C-rich food) 30 tablet 3    Misc. Devices (GOJJI WEIGHT SCALE) MISC 1 Device by Does not apply route every 30 (thirty) days. 1 each 0    Prenatal Vit-Fe Phos-FA-Omega (VITAFOL GUMMIES) 3.33-0.333-34.8 MG CHEW Chew 3 tablets by mouth daily. 90 tablet 11    Review of Systems - per HPI  Vitals:  BP 128/67 (BP Location: Left Arm)   Pulse 97   Temp 98.4 F (36.9 C) (Oral)   Resp 20   Ht 5' 9"$  (1.753 m)   Wt 102.1 kg   SpO2 94%   Breastfeeding Yes   BMI 33.23 kg/m  Physical Examination: Constitutional:      General: She is not in acute distress.    Appearance: She is well-developed.  HENT:     Head: Normocephalic and atraumatic.  Cardiovascular:     Rate and Rhythm: Normal rate and regular rhythm.  Pulmonary:     Breath sounds: Normal breath sounds. No wheezing, rhonchi or rales.  Chest:     Chest wall: No tenderness.  Abdominal:     Tenderness: There is no abdominal tenderness.  Musculoskeletal:     Right lower leg: Edema present.     Left lower leg: Edema  present.  Neurological:     Mental Status: She is alert and oriented to person, place, and time.  Psychiatric:        Mood and Affect: Mood normal  Labs:  Results for orders placed or performed during the hospital encounter of 11/01/22 (from the past 24 hour(s))  Comprehensive metabolic panel   Collection Time: 11/01/22  4:44 PM  Result Value Ref Range   Sodium 137 135 - 145 mmol/L   Potassium 4.2 3.5 - 5.1 mmol/L   Chloride 108 98 - 111 mmol/L   CO2 22 22 - 32 mmol/L   Glucose, Bld 88 70 - 99 mg/dL   BUN 10 6 - 20 mg/dL   Creatinine, Ser 0.82 0.44 - 1.00 mg/dL   Calcium 8.5 (L) 8.9 - 10.3 mg/dL   Total Protein 5.9 (L) 6.5 - 8.1 g/dL   Albumin 2.6 (L) 3.5 - 5.0 g/dL   AST 138 (H) 15 - 41 U/L   ALT 211 (H) 0 - 44 U/L   Alkaline Phosphatase 129 (H) 38 - 126 U/L   Total Bilirubin 0.4 0.3 - 1.2 mg/dL   GFR, Estimated >60 >60 mL/min   Anion gap 7 5 - 15  CBC   Collection Time: 11/01/22  4:44 PM  Result Value Ref Range   WBC 5.9 4.0 - 10.5 K/uL   RBC 3.80 (L) 3.87 - 5.11 MIL/uL   Hemoglobin 10.3 (L) 12.0 - 15.0 g/dL   HCT 30.0 (L) 36.0 - 46.0 %   MCV 78.9 (L) 80.0 - 100.0 fL   MCH 27.1 26.0 - 34.0 pg   MCHC 34.3 30.0 - 36.0 g/dL   RDW 15.4 11.5 - 15.5 %   Platelets 203 150 - 400 K/uL   nRBC 0.7 (H) 0.0 - 0.2 %  D-dimer, quantitative   Collection Time: 11/01/22  4:44 PM  Result Value Ref Range   D-Dimer, Quant 14.51 (H) 0.00 - 0.50 ug/mL-FEU  Troponin I (High Sensitivity)   Collection Time: 11/01/22  4:44 PM  Result Value Ref Range   Troponin I (High Sensitivity) 9 <18 ng/L  Brain natriuretic peptide   Collection Time: 11/01/22  4:48 PM  Result Value Ref Range   B Natriuretic Peptide 621.2 (H) 0.0 - 100.0 pg/mL  Resp panel by RT-PCR (RSV, Flu A&B, Covid) Anterior Nasal Swab   Collection Time: 11/01/22  5:19 PM   Specimen: Anterior Nasal Swab  Result Value Ref Range   SARS Coronavirus 2 by RT PCR NEGATIVE NEGATIVE   Influenza A by PCR NEGATIVE NEGATIVE   Influenza B  by PCR NEGATIVE NEGATIVE   Resp Syncytial Virus by PCR NEGATIVE NEGATIVE   Imaging Studies: DG Chest 2 View  Result Date: 11/01/2022 CLINICAL DATA:  Postpartum hypertension short of breath EXAM: CHEST - 2 VIEW COMPARISON:  06/14/2016 FINDINGS: Trace pleural effusions. Vascular congestion and mild interstitial edema. Normal cardiac size. No consolidation or pneumothorax IMPRESSION: Vascular congestion and mild interstitial edema with small pleural effusions. Electronically Signed   By: Donavan Foil M.D.   On: 11/01/2022 18:27    Assessment and Plan: Patient Active Problem List   Diagnosis Date Noted   Preeclampsia in postpartum period 11/01/2022   Labor and delivery, indication for care 10/27/2022   Rh negative state in antepartum period 07/30/2022   Trichomonas vaginalis (TV) infection 05/14/2022   History of herpes genitalis 05/11/2022   Sickle cell trait in mother affecting pregnancy ([place]) 05/11/2022   Supervision of other normal pregnancy, antepartum 04/05/2022   Postpartum preeclampsia w/ SF (BP, LFTs) - Mag 4/2 x 24h - procardia XL 78m daily - lasix 241mBID - suspect fluid overload is preeclampsia related, but will obtain echo if pt does not respond to the above interventions as expected - repeat labs in AM  2. Anemia - continue po iron  [facility]  Gale Journey, MD Obstetrician & Gynecologist, Faculty Practice Faculty Practice, [facility]

## 2022-11-02 DIAGNOSIS — O1415 Severe pre-eclampsia, complicating the puerperium: Secondary | ICD-10-CM | POA: Diagnosis not present

## 2022-11-02 DIAGNOSIS — Z79899 Other long term (current) drug therapy: Secondary | ICD-10-CM | POA: Diagnosis not present

## 2022-11-02 DIAGNOSIS — O9903 Anemia complicating the puerperium: Secondary | ICD-10-CM | POA: Diagnosis present

## 2022-11-02 DIAGNOSIS — Z6791 Unspecified blood type, Rh negative: Secondary | ICD-10-CM | POA: Diagnosis not present

## 2022-11-02 DIAGNOSIS — D573 Sickle-cell trait: Secondary | ICD-10-CM | POA: Diagnosis present

## 2022-11-02 DIAGNOSIS — Z87891 Personal history of nicotine dependence: Secondary | ICD-10-CM | POA: Diagnosis not present

## 2022-11-02 DIAGNOSIS — Z20822 Contact with and (suspected) exposure to covid-19: Secondary | ICD-10-CM | POA: Diagnosis present

## 2022-11-02 LAB — CBC
HCT: 30 % — ABNORMAL LOW (ref 36.0–46.0)
Hemoglobin: 10.1 g/dL — ABNORMAL LOW (ref 12.0–15.0)
MCH: 26.2 pg (ref 26.0–34.0)
MCHC: 33.7 g/dL (ref 30.0–36.0)
MCV: 77.9 fL — ABNORMAL LOW (ref 80.0–100.0)
Platelets: 211 10*3/uL (ref 150–400)
RBC: 3.85 MIL/uL — ABNORMAL LOW (ref 3.87–5.11)
RDW: 15.4 % (ref 11.5–15.5)
WBC: 5.2 10*3/uL (ref 4.0–10.5)
nRBC: 0.4 % — ABNORMAL HIGH (ref 0.0–0.2)

## 2022-11-02 LAB — COMPREHENSIVE METABOLIC PANEL
ALT: 190 U/L — ABNORMAL HIGH (ref 0–44)
AST: 101 U/L — ABNORMAL HIGH (ref 15–41)
Albumin: 2.5 g/dL — ABNORMAL LOW (ref 3.5–5.0)
Alkaline Phosphatase: 123 U/L (ref 38–126)
Anion gap: 11 (ref 5–15)
BUN: 11 mg/dL (ref 6–20)
CO2: 23 mmol/L (ref 22–32)
Calcium: 7.8 mg/dL — ABNORMAL LOW (ref 8.9–10.3)
Chloride: 102 mmol/L (ref 98–111)
Creatinine, Ser: 0.66 mg/dL (ref 0.44–1.00)
GFR, Estimated: >60 mL/min (ref >60)
Glucose, Bld: 92 mg/dL (ref 70–99)
Potassium: 3.6 mmol/L (ref 3.5–5.1)
Sodium: 136 mmol/L (ref 135–145)
Total Bilirubin: 0.2 mg/dL — ABNORMAL LOW (ref 0.3–1.2)
Total Protein: 5.7 g/dL — ABNORMAL LOW (ref 6.5–8.1)

## 2022-11-02 LAB — BRAIN NATRIURETIC PEPTIDE: B Natriuretic Peptide: 423.9 pg/mL — ABNORMAL HIGH (ref 0.0–100.0)

## 2022-11-02 NOTE — Progress Notes (Signed)
Faculty Practice OB/GYN Attending Note  Subjective:  Patient is doing well this morning. Reports mild headache (has not taken any medication), denies any visual symptoms, RUQ/epigastric pain or other concerning symptoms.  Admitted on 11/01/2022 for Severe Preeclampsia in postpartum period.    Objective:  Blood pressure 117/70, pulse 89, temperature 98.4 F (36.9 C), temperature source Oral, resp. rate 18, height 5' 9"$  (1.753 m), weight 102.1 kg, SpO2 98 %, currently breastfeeding. Patient Vitals for the past 24 hrs:  BP Temp Temp src Pulse Resp SpO2 Height Weight  11/02/22 0500 117/70 -- -- 89 18 98 % -- --  11/02/22 0400 118/63 -- -- 78 20 -- -- --  11/02/22 0300 127/76 -- -- 70 18 -- -- --  11/02/22 0200 118/80 -- -- 82 18 -- -- --  11/02/22 0100 116/73 -- -- 81 18 -- -- --  11/02/22 0000 104/78 -- -- (!) 101 18 -- -- --  11/01/22 2309 119/71 -- -- 79 19 99 % -- --  11/01/22 2205 119/67 -- -- (!) 110 19 99 % -- --  11/01/22 2100 120/74 -- -- (!) 103 20 94 % -- --  11/01/22 2036 128/67 -- -- 97 20 94 % -- --  11/01/22 2025 134/74 -- Oral 95 (!) 24 96 % -- --  11/01/22 2015 135/80 98.4 F (36.9 C) Oral 75 (!) 24 96 % -- --  11/01/22 1851 (!) 146/93 99.1 F (37.3 C) Oral 73 18 96 % -- --  11/01/22 1758 135/78 -- -- (!) 114 -- -- -- --  11/01/22 1732 (!) 166/92 -- -- -- -- -- -- --  11/01/22 1730 (!) 166/92 -- -- (!) 57 -- -- -- --  11/01/22 1715 (!) 155/95 -- -- (!) 58 -- -- -- --  11/01/22 1700 (!) 146/90 -- -- (!) 56 -- 98 % -- --  11/01/22 1651 (!) 146/92 -- -- (!) 58 -- -- -- --  11/01/22 1628 (!) 165/85 -- -- (!) 58 -- -- -- --  11/01/22 1611 (!) 157/88 99.4 F (37.4 C) -- 67 20 97 % 5' 9"$  (1.753 m) 102.1 kg   Gen: NAD HENT: Normocephalic, atraumatic Lungs: Normal respiratory effort Heart: Regular rate noted Abdomen: NT, soft, fundus firm Ext: 2+ DTRs, no edema, no cyanosis, negative Homan's sign  Labs:    Latest Ref Rng & Units 11/02/2022    5:33 AM 11/01/2022     4:44 PM 10/27/2022    2:07 AM  CBC  WBC 4.0 - 10.5 K/uL 5.2  5.9  7.4   Hemoglobin 12.0 - 15.0 g/dL 10.1  10.3  10.5   Hematocrit 36.0 - 46.0 % 30.0  30.0  30.8   Platelets 150 - 400 K/uL 211  203  180       Latest Ref Rng & Units 11/02/2022    5:33 AM 11/01/2022    4:44 PM 06/13/2016    8:38 PM  CMP  Glucose 70 - 99 mg/dL 92  88  108   BUN 6 - 20 mg/dL 11  10  11   $ Creatinine 0.44 - 1.00 mg/dL 0.66  0.82  0.89   Sodium 135 - 145 mmol/L 136  137  135   Potassium 3.5 - 5.1 mmol/L 3.6  4.2  3.3   Chloride 98 - 111 mmol/L 102  108  106   CO2 22 - 32 mmol/L 23  22  23   $ Calcium 8.9 - 10.3 mg/dL 7.8  8.5  8.8   Total Protein 6.5 - 8.1 g/dL 5.7  5.9  6.3   Total Bilirubin 0.3 - 1.2 mg/dL 0.2  0.4  0.8   Alkaline Phos 38 - 126 U/L 123  129  61   AST 15 - 41 U/L 101  138  16   ALT 0 - 44 U/L 190  211  14     Assessment & Plan:  27 y.o. [id number] PPD#6 s/p SVD admitted 11/01/22 for postpartum severe preeclampsia.   - Continue magnesium sulfate infusion for 24 hours, up around 1900 tonight. Patient aware of need to monitor for at least four hours afterwards, will likely be discharge to home tomorrow morning if remains stable.  - Continue Nifedipine and Lasix as ordered - Continue close observation and routine postpartum care.   Verita Schneiders, MD, [place] for [employer], [place]

## 2022-11-03 ENCOUNTER — Encounter: Payer: Self-pay | Admitting: Obstetrics and Gynecology

## 2022-11-03 LAB — CBC
HCT: 30.5 % — ABNORMAL LOW (ref 36.0–46.0)
Hemoglobin: 9.9 g/dL — ABNORMAL LOW (ref 12.0–15.0)
MCH: 25.9 pg — ABNORMAL LOW (ref 26.0–34.0)
MCHC: 32.5 g/dL (ref 30.0–36.0)
MCV: 79.8 fL — ABNORMAL LOW (ref 80.0–100.0)
Platelets: 205 10*3/uL (ref 150–400)
RBC: 3.82 MIL/uL — ABNORMAL LOW (ref 3.87–5.11)
RDW: 15.6 % — ABNORMAL HIGH (ref 11.5–15.5)
WBC: 5.1 10*3/uL (ref 4.0–10.5)
nRBC: 0 % (ref 0.0–0.2)

## 2022-11-03 LAB — COMPREHENSIVE METABOLIC PANEL
ALT: 129 U/L — ABNORMAL HIGH (ref 0–44)
AST: 46 U/L — ABNORMAL HIGH (ref 15–41)
Albumin: 2.4 g/dL — ABNORMAL LOW (ref 3.5–5.0)
Alkaline Phosphatase: 124 U/L (ref 38–126)
Anion gap: 10 (ref 5–15)
BUN: 13 mg/dL (ref 6–20)
CO2: 23 mmol/L (ref 22–32)
Calcium: 7.5 mg/dL — ABNORMAL LOW (ref 8.9–10.3)
Chloride: 102 mmol/L (ref 98–111)
Creatinine, Ser: 0.75 mg/dL (ref 0.44–1.00)
GFR, Estimated: >60 mL/min (ref >60)
Glucose, Bld: 83 mg/dL (ref 70–99)
Potassium: 3.9 mmol/L (ref 3.5–5.1)
Sodium: 135 mmol/L (ref 135–145)
Total Bilirubin: 0.3 mg/dL (ref 0.3–1.2)
Total Protein: 5.4 g/dL — ABNORMAL LOW (ref 6.5–8.1)

## 2022-11-03 MED ORDER — FUROSEMIDE 20 MG PO TABS: 20.0000 mg | ORAL_TABLET | Freq: Two times a day (BID) | ORAL | 0 refills | Status: DC

## 2022-11-03 MED ORDER — NIFEDIPINE ER 30 MG PO TB24: 30.0000 mg | ORAL_TABLET | Freq: Every day | ORAL | 0 refills | Status: DC

## 2022-11-03 NOTE — Discharge Summary (Signed)
Antenatal Physician Discharge Summary  Patient ID: TARISSA BUCKNAM MRN: [id number] DOB/AGE: 1996-08-09 27 y.o.  Admit date: 11/01/2022 Discharge date: 11/03/2022  Admission Diagnoses: Preeclampsia in postpartum period [O14.95]  Discharge Diagnoses:  As above  Prenatal Procedures: Preeclampsia Consults: None  Hospital Course:  MIMI SHUGARS is a 27 y.o. [id number] who is PPD7 s/p SVD who was readmitted on 2/15 for postpartum preeclampsia with severe features by blood pressures, LFTs, and pulmonary edema.   Bibi presented on PPD5 with 2-3 days of shortness of breath, substernal chest pain. and swelling in her hands and legs. She had no prior history of hypertension in pregnancy. In the [facility], she had severe range blood pressures and transaminitis (ALT 212, AST 138). She also had evidence of fluid overload with BNP 621.1 and CXR with vascular congestion and mild interstitial edema with small pleural effusions. Her EKG was unremarkable and trop negative. She was admitted to [facility] for further management.   Pt received magnesium 4/2 x 24 hours. She was started on procardia XL 50m daily and lasix 260mBID. She had rapid improvement in her symptoms and blood pressures. On day of discharge, she reported resolution of her shortness of breath and chest pain. Her peripheral edema had significantly improved and she was normotensive. LFTs also improved to AST 46 and ALT 129. She was discharged home in good condition on procardia XL 3077maily and lasix 69m67mD for total 5 days. Strict return precautions were reviewed. She was enrolled in babyscripts. She will follow up in 1 week for blood pressure check and for outpatient cardio-OB referral. A message was routed to the office for coordination of these appointments.   Discharge Exam: Temp:  [98 F (36.7 C)-98.7 F (37.1 C)] 98.7 F (37.1 C) (02/17 1211) Pulse Rate:  [74-85] 75 (02/17 1211) Resp:  [14-20] 18 (02/17 1211) BP:  (110-129)/(59-84) 126/77 (02/17 1211) SpO2:  [97 %-98 %] 98 % (02/17 1211) Physical Examination: CONSTITUTIONAL: Well-developed, well-nourished female in no acute distress.  SKIN: Skin is warm and dry. No rash noted. Not diaphoretic. No erythema. No pallor. NEUROLOGIC: Alert and oriented to person, place, and time. PSYCHIATRIC: Normal mood and affect. Normal behavior. Normal judgment and thought content. CARDIOVASCULAR: Normal heart rate noted, regular rhythm RESPIRATORY: Effort and breath sounds normal, no problems with respiration noted ABDOMEN: Soft, nontender, nondistended  Significant Diagnostic Studies:  Results for orders placed or performed during the hospital encounter of 11/01/22 (from the past 168 hour(s))  Comprehensive metabolic panel   Collection Time: 11/01/22  4:44 PM  Result Value Ref Range   Sodium 137 135 - 145 mmol/L   Potassium 4.2 3.5 - 5.1 mmol/L   Chloride 108 98 - 111 mmol/L   CO2 22 22 - 32 mmol/L   Glucose, Bld 88 70 - 99 mg/dL   BUN 10 6 - 20 mg/dL   Creatinine, Ser 0.82 0.44 - 1.00 mg/dL   Calcium 8.5 (L) 8.9 - 10.3 mg/dL   Total Protein 5.9 (L) 6.5 - 8.1 g/dL   Albumin 2.6 (L) 3.5 - 5.0 g/dL   AST 138 (H) 15 - 41 U/L   ALT 211 (H) 0 - 44 U/L   Alkaline Phosphatase 129 (H) 38 - 126 U/L   Total Bilirubin 0.4 0.3 - 1.2 mg/dL   GFR, Estimated >60 >60 mL/min   Anion gap 7 5 - 15  CBC   Collection Time: 11/01/22  4:44 PM  Result Value Ref Range   WBC 5.9  4.0 - 10.5 K/uL   RBC 3.80 (L) 3.87 - 5.11 MIL/uL   Hemoglobin 10.3 (L) 12.0 - 15.0 g/dL   HCT 30.0 (L) 36.0 - 46.0 %   MCV 78.9 (L) 80.0 - 100.0 fL   MCH 27.1 26.0 - 34.0 pg   MCHC 34.3 30.0 - 36.0 g/dL   RDW 15.4 11.5 - 15.5 %   Platelets 203 150 - 400 K/uL   nRBC 0.7 (H) 0.0 - 0.2 %  D-dimer, quantitative   Collection Time: 11/01/22  4:44 PM  Result Value Ref Range   D-Dimer, Quant 14.51 (H) 0.00 - 0.50 ug/mL-FEU  Troponin I (High Sensitivity)   Collection Time: 11/01/22  4:44 PM  Result  Value Ref Range   Troponin I (High Sensitivity) 9 <18 ng/L  Brain natriuretic peptide   Collection Time: 11/01/22  4:48 PM  Result Value Ref Range   B Natriuretic Peptide 621.2 (H) 0.0 - 100.0 pg/mL  Resp panel by RT-PCR (RSV, Flu A&B, Covid) Anterior Nasal Swab   Collection Time: 11/01/22  5:19 PM   Specimen: Anterior Nasal Swab  Result Value Ref Range   SARS Coronavirus 2 by RT PCR NEGATIVE NEGATIVE   Influenza A by PCR NEGATIVE NEGATIVE   Influenza B by PCR NEGATIVE NEGATIVE   Resp Syncytial Virus by PCR NEGATIVE NEGATIVE  CBC   Collection Time: 11/02/22  5:33 AM  Result Value Ref Range   WBC 5.2 4.0 - 10.5 K/uL   RBC 3.85 (L) 3.87 - 5.11 MIL/uL   Hemoglobin 10.1 (L) 12.0 - 15.0 g/dL   HCT 30.0 (L) 36.0 - 46.0 %   MCV 77.9 (L) 80.0 - 100.0 fL   MCH 26.2 26.0 - 34.0 pg   MCHC 33.7 30.0 - 36.0 g/dL   RDW 15.4 11.5 - 15.5 %   Platelets 211 150 - 400 K/uL   nRBC 0.4 (H) 0.0 - 0.2 %  Comprehensive metabolic panel   Collection Time: 11/02/22  5:33 AM  Result Value Ref Range   Sodium 136 135 - 145 mmol/L   Potassium 3.6 3.5 - 5.1 mmol/L   Chloride 102 98 - 111 mmol/L   CO2 23 22 - 32 mmol/L   Glucose, Bld 92 70 - 99 mg/dL   BUN 11 6 - 20 mg/dL   Creatinine, Ser 0.66 0.44 - 1.00 mg/dL   Calcium 7.8 (L) 8.9 - 10.3 mg/dL   Total Protein 5.7 (L) 6.5 - 8.1 g/dL   Albumin 2.5 (L) 3.5 - 5.0 g/dL   AST 101 (H) 15 - 41 U/L   ALT 190 (H) 0 - 44 U/L   Alkaline Phosphatase 123 38 - 126 U/L   Total Bilirubin 0.2 (L) 0.3 - 1.2 mg/dL   GFR, Estimated >60 >60 mL/min   Anion gap 11 5 - 15  Brain natriuretic peptide   Collection Time: 11/02/22  5:33 AM  Result Value Ref Range   B Natriuretic Peptide 423.9 (H) 0.0 - 100.0 pg/mL  CBC   Collection Time: 11/03/22  4:21 AM  Result Value Ref Range   WBC 5.1 4.0 - 10.5 K/uL   RBC 3.82 (L) 3.87 - 5.11 MIL/uL   Hemoglobin 9.9 (L) 12.0 - 15.0 g/dL   HCT 30.5 (L) 36.0 - 46.0 %   MCV 79.8 (L) 80.0 - 100.0 fL   MCH 25.9 (L) 26.0 - 34.0 pg    MCHC 32.5 30.0 - 36.0 g/dL   RDW 15.6 (H) 11.5 - 15.5 %  Platelets 205 150 - 400 K/uL   nRBC 0.0 0.0 - 0.2 %  Comprehensive metabolic panel   Collection Time: 11/03/22  4:21 AM  Result Value Ref Range   Sodium 135 135 - 145 mmol/L   Potassium 3.9 3.5 - 5.1 mmol/L   Chloride 102 98 - 111 mmol/L   CO2 23 22 - 32 mmol/L   Glucose, Bld 83 70 - 99 mg/dL   BUN 13 6 - 20 mg/dL   Creatinine, Ser 0.75 0.44 - 1.00 mg/dL   Calcium 7.5 (L) 8.9 - 10.3 mg/dL   Total Protein 5.4 (L) 6.5 - 8.1 g/dL   Albumin 2.4 (L) 3.5 - 5.0 g/dL   AST 46 (H) 15 - 41 U/L   ALT 129 (H) 0 - 44 U/L   Alkaline Phosphatase 124 38 - 126 U/L   Total Bilirubin 0.3 0.3 - 1.2 mg/dL   GFR, Estimated >60 >60 mL/min   Anion gap 10 5 - 15   DG Chest 2 View  Result Date: 11/01/2022 CLINICAL DATA:  Postpartum hypertension short of breath EXAM: CHEST - 2 VIEW COMPARISON:  06/14/2016 FINDINGS: Trace pleural effusions. Vascular congestion and mild interstitial edema. Normal cardiac size. No consolidation or pneumothorax IMPRESSION: Vascular congestion and mild interstitial edema with small pleural effusions. Electronically Signed   By: Donavan Foil M.D.   On: 11/01/2022 18:27    Future Appointments  Date Time Provider [place]  12/10/2022  2:15 PM Darliss Cheney, MD [facility]    Discharge Condition: Stable  Discharge disposition: 01-Home or Self Care       Discharge Instructions     Activity as tolerated - No restrictions   Complete by: As directed    Babyscripts Post-Partum Hypertension   Complete by: As directed    Date of delivery: 10/27/2022   Expected date of discharge: 11/03/2022   Call MD for:  difficulty breathing, headache or visual disturbances   Complete by: As directed    Call MD for:  persistant nausea and vomiting   Complete by: As directed    Call MD for:  redness, tenderness, or signs of infection (pain, swelling, redness, odor or green/yellow discharge around incision site)    Complete by: As directed    Call MD for:  severe uncontrolled pain   Complete by: As directed    Call MD for:  temperature >100.4   Complete by: As directed    Diet general   Complete by: As directed    Sexual Activity Restrictions   Complete by: As directed    Avoid sexual activity until cleared by provider      Allergies as of 11/03/2022   No Known Allergies      Medication List     STOP taking these medications    Gojji Weight Scale Misc       TAKE these medications    acetaminophen 500 MG tablet Commonly known as: TYLENOL Take 1,000 mg by mouth every 6 (six) hours as needed for mild pain.   benzocaine-Menthol 20-0.5 % Aero Commonly known as: DERMOPLAST Apply 1 Application topically as needed for irritation (perineal discomfort).   Blood Pressure Kit Devi 1 kit by Does not apply route once a week.   ferrous sulfate 325 (65 FE) MG tablet Take 1 tablet (325 mg total) by mouth every other day. Take with a a fruit or other vitamin C rich food. What changed:  when to take this additional instructions   furosemide 20 MG  tablet Commonly known as: LASIX Take 1 tablet (20 mg total) by mouth 2 (two) times daily.   NIFEdipine 30 MG 24 hr tablet Commonly known as: ADALAT CC Take 1 tablet (30 mg total) by mouth daily. Start taking on: November 04, 2022   Vitafol Gummies 3.33-0.333-34.8 MG Chew Chew 3 tablets by mouth daily.        Follow-up [place] [facility] at [facility] Follow up in 1 week(s).   Specialty: Obstetrics and Gynecology Why: We will help you coordinate a blood pressure check in 1 week Contact information: [place] [phone]        Berniece Salines, DO Follow up.   Specialty: Cardiology Why: We will help you arrange a follow up appointment with cardiology Contact information: [address] [place] [address] [phone]                 Total discharge time: 45 minutes   Signed: Inez Catalina M.D. 11/03/2022, 1:18 PM

## 2022-11-03 NOTE — Progress Notes (Signed)
Patient discharged, ambulatory off unit. Patient given discharge instructions and denies any questions or concerns.

## 2022-11-04 ENCOUNTER — Other Ambulatory Visit: Payer: Self-pay | Admitting: Obstetrics and Gynecology

## 2022-11-07 ENCOUNTER — Other Ambulatory Visit: Payer: Self-pay

## 2022-11-07 ENCOUNTER — Telehealth (HOSPITAL_COMMUNITY): Payer: Self-pay | Admitting: *Deleted

## 2022-11-07 ENCOUNTER — Encounter: Payer: Self-pay | Admitting: Family Medicine

## 2022-11-07 NOTE — Telephone Encounter (Signed)
Patient voiced no questions or concerns regarding her health at this time. EPDS=0. Patient voiced no questions or concerns regarding infant at this time. Patient reports infant sleeps in a bassinet on his back. RN reviewed ABCs of safe sleep. Patient verbalized understanding. Patient requested RN email information on hospital's virtual postpartum classes and support groups. Email sent. Erline Levine, RN, 11/07/22, [phone]

## 2022-11-08 ENCOUNTER — Encounter: Payer: Self-pay | Admitting: Cardiology

## 2022-11-08 ENCOUNTER — Ambulatory Visit: Payer: Medicaid Other | Attending: Cardiology | Admitting: Cardiology

## 2022-11-08 VITALS — BP 110/70 | HR 90 | Ht 69.0 in | Wt 213.0 lb

## 2022-11-08 DIAGNOSIS — Z8759 Personal history of other complications of pregnancy, childbirth and the puerperium: Secondary | ICD-10-CM

## 2022-11-08 DIAGNOSIS — R9431 Abnormal electrocardiogram [ECG] [EKG]: Secondary | ICD-10-CM

## 2022-11-08 DIAGNOSIS — O1495 Unspecified pre-eclampsia, complicating the puerperium: Secondary | ICD-10-CM

## 2022-11-08 DIAGNOSIS — O165 Unspecified maternal hypertension, complicating the puerperium: Secondary | ICD-10-CM

## 2022-11-08 NOTE — Patient Instructions (Addendum)
Medication Instructions:  Your physician recommends that you continue on your current medications as directed. Please refer to the Current Medication list given to you today.   Please take your blood pressure daily for 2 weeks and send in a MyChart message. Please include heart rates.   HOW TO TAKE YOUR BLOOD PRESSURE: Rest 5 minutes before taking your blood pressure. Don't smoke or drink caffeinated beverages for at least 30 minutes before. Take your blood pressure before (not after) you eat. Sit comfortably with your back supported and both feet on the floor (don't cross your legs). Elevate your arm to heart level on a table or a desk. Use the proper sized cuff. It should fit smoothly and snugly around your bare upper arm. There should be enough room to slip a fingertip under the cuff. The bottom edge of the cuff should be 1 inch above the crease of the elbow. Ideally, take 3 measurements at one sitting and record the average.  *If you need a refill on your cardiac medications before your next appointment, please call your pharmacy*   Lab Work: None   Testing/Procedures: Your physician has requested that you have an echocardiogram. Echocardiography is a painless test that uses sound waves to create images of your heart. It provides your doctor with information about the size and shape of your heart and how well your heart's chambers and valves are working. This procedure takes approximately one hour. There are no restrictions for this procedure. Please do NOT wear cologne, perfume, aftershave, or lotions (deodorant is allowed). Please arrive 15 minutes prior to your appointment time.    Follow-Up: At [facility], you and your health needs are our priority.  As part of our continuing mission to provide you with exceptional heart care, we have created designated Provider Care Teams.  These Care Teams include your primary Cardiologist (physician) and Advanced Practice Providers  (APPs -  Physician Assistants and Nurse Practitioners) who all work together to provide you with the care you need, when you need it.   Your next appointment:   12 week(s)  Provider:   Berniece Salines, DO

## 2022-11-08 NOTE — Progress Notes (Signed)
Cardio-Obstetrics Clinic  New Evaluation  Date:  11/08/2022   ID:  Theresa Lam, DOB [birth year], MRN [id number]  PCP:  [facility] Providers Cardiologist:  None  Electrophysiologist:  None       Referring MD: Center, [place]   Chief Complaint:  " I am ok"  History of Present Illness:    Theresa Lam is a 27 y.o. female [G3P2012] who is being seen today for the evaluation of Postpartum hypertension at the request of Center, [place] history includes postpartum hypertension, asthma, obesity here today to be evaluated.  She recently deliver a baby boy on February 10.  Few days ago She Ended up in the ED with a Systolic of [id number].  At That Time She Was Started on Antihypertensive Medications Nifedipine 30 Mg daily.  Prior CV Studies Reviewed: The following studies were reviewed today: None    Past Medical History:  Diagnosis Date   HSV-2 infection    Sickle cell trait ([place])     Past Surgical History:  Procedure Laterality Date   NO PAST SURGERIES        OB History     Gravida  3   Para  2   Term  2   Preterm      AB  1   Living  2      SAB      IAB      Ectopic      Multiple  0   Live Births  2               Current Medications: Current Meds  Medication Sig   acetaminophen (TYLENOL) 500 MG tablet Take 1,000 mg by mouth every 6 (six) hours as needed for mild pain.   Blood Pressure Monitoring (BLOOD PRESSURE KIT) DEVI 1 kit by Does not apply route once a week.   ferrous sulfate 325 (65 FE) MG tablet Take 1 tablet (325 mg total) by mouth every other day. Take with a a fruit or other vitamin C rich food. (Patient taking differently: Take 325 mg by mouth See admin instructions. Take 325 mg by mouth every other day with a a fruit or other vitamin C-rich food)   Prenatal Vit-Fe Phos-FA-Omega (VITAFOL GUMMIES) 3.33-0.333-34.8 MG CHEW Chew 3 tablets by mouth daily.      Allergies:   Patient has no known allergies.   Social History   Socioeconomic History   Marital status: Single    Spouse name: Not on file   Number of children: Not on file   Years of education: Not on file   Highest education level: Not on file  Occupational History   Not on file  Tobacco Use   Smoking status: Former    Types: Cigars    Quit date: 2023    Years since quitting: 1.1   Smokeless tobacco: Never  Vaping Use   Vaping Use: Never used  Substance and Sexual Activity   Alcohol use: Not Currently    Comment: last 10/27/21   Drug use: Not Currently    Types: Marijuana    Comment: daily, not since confirmed pregnancy   Sexual activity: Yes    Partners: Male    Birth control/protection: None  Other Topics Concern   Not on file  Social History Narrative   Not on file   Social Determinants of Health   Financial Resource Strain: Not on file  Food Insecurity: No  Food Insecurity (11/01/2022)   Hunger Vital Sign    Worried About Running Out of Food in the Last Year: Never true    Ran Out of Food in the Last Year: Never true  Transportation Needs: No Transportation Needs (11/01/2022)   PRAPARE - Hydrologist (Medical): No    Lack of Transportation (Non-Medical): No  Physical Activity: Not on file  Stress: Not on file  Social Connections: Not on file      Family History  Problem Relation Age of Onset   Healthy Mother    Cancer Father    Hypertension Maternal Grandmother    Diabetes Paternal Grandfather       ROS:   Please see the history of present illness.     All other systems reviewed and are negative.   Labs/EKG Reviewed:    EKG:   EKG is was not ordered today.    Recent Labs: 11/02/2022: B Natriuretic Peptide 423.9 11/03/2022: ALT 129; BUN 13; Creatinine, Ser 0.75; Hemoglobin 9.9; Platelets 205; Potassium 3.9; Sodium 135   Recent Lipid Panel No results found for: "CHOL", "TRIG", "HDL", "CHOLHDL", "LDLCALC",  "LDLDIRECT"  Physical Exam:    VS:  BP 110/70   Pulse 90   Ht 5' 9"$  (1.753 m)   Wt 96.6 kg   SpO2 95%   BMI 31.45 kg/m     Wt Readings from Last 3 Encounters:  11/08/22 96.6 kg  11/01/22 102.1 kg  10/27/22 101.8 kg     GEN: Well nourished, well developed in no acute distress HEENT: Normal NECK: No JVD; No carotid bruits LYMPHATICS: No lymphadenopathy CARDIAC: RRR, no murmurs, rubs, gallops RESPIRATORY:  Clear to auscultation without rales, wheezing or rhonchi  ABDOMEN: Soft, non-tender, non-distended MUSCULOSKELETAL:  No edema; No deformity  SKIN: Warm and dry NEUROLOGIC:  Alert and oriented x 3 PSYCHIATRIC:  Normal affect    Risk Assessment/Risk Calculators:                 ASSESSMENT & PLAN:    Postpartum hypertension Obesity  Her blood pressure in the office today is is at target.  Will continue her current medication.  The only issue that I have is her EKG showed evidence of right atrial enlargement.  With elevated blood pressure I am going to get an echocardiogram to assess for any structural abnormalities.  Patient Instructions  Medication Instructions:  Your physician recommends that you continue on your current medications as directed. Please refer to the Current Medication list given to you today.   Please take your blood pressure daily for 2 weeks and send in a MyChart message. Please include heart rates.   HOW TO TAKE YOUR BLOOD PRESSURE: Rest 5 minutes before taking your blood pressure. Don't smoke or drink caffeinated beverages for at least 30 minutes before. Take your blood pressure before (not after) you eat. Sit comfortably with your back supported and both feet on the floor (don't cross your legs). Elevate your arm to heart level on a table or a desk. Use the proper sized cuff. It should fit smoothly and snugly around your bare upper arm. There should be enough room to slip a fingertip under the cuff. The bottom edge of the cuff should be 1  inch above the crease of the elbow. Ideally, take 3 measurements at one sitting and record the average.  *If you need a refill on your cardiac medications before your next appointment, please call your pharmacy*   Lab  Work: None   Testing/Procedures: Your physician has requested that you have an echocardiogram. Echocardiography is a painless test that uses sound waves to create images of your heart. It provides your doctor with information about the size and shape of your heart and how well your heart's chambers and valves are working. This procedure takes approximately one hour. There are no restrictions for this procedure. Please do NOT wear cologne, perfume, aftershave, or lotions (deodorant is allowed). Please arrive 15 minutes prior to your appointment time.    Follow-Up: At [facility], you and your health needs are our priority.  As part of our continuing mission to provide you with exceptional heart care, we have created designated Provider Care Teams.  These Care Teams include your primary Cardiologist (physician) and Advanced Practice Providers (APPs -  Physician Assistants and Nurse Practitioners) who all work together to provide you with the care you need, when you need it.   Your next appointment:   12 week(s)  Provider:   Berniece Salines, DO    Dispo:  No follow-ups on file.   Medication Adjustments/Labs and Tests Ordered: Current medicines are reviewed at length with the patient today.  Concerns regarding medicines are outlined above.  Tests Ordered: Orders Placed This Encounter  Procedures   EKG 12-Lead   ECHOCARDIOGRAM COMPLETE   Medication Changes: No orders of the defined types were placed in this encounter.

## 2022-11-12 ENCOUNTER — Ambulatory Visit (INDEPENDENT_AMBULATORY_CARE_PROVIDER_SITE_OTHER): Payer: Medicaid Other | Admitting: *Deleted

## 2022-11-12 ENCOUNTER — Other Ambulatory Visit: Payer: Self-pay

## 2022-11-12 VITALS — BP 110/71 | HR 115 | Ht 69.0 in | Wt 204.9 lb

## 2022-11-12 DIAGNOSIS — Z0130 Encounter for examination of blood pressure without abnormal findings: Secondary | ICD-10-CM

## 2022-11-12 NOTE — Progress Notes (Signed)
Here for bp check s/p postpartum pre-eclampsia . States still has headache every 3 days or so , relieved with extra strength tylenol. Edema is much less- not just trace in ankles /feet. Is taking Procardia daily. BP wnl. Advised to continue taking procardia as ordered. Advised to keep pp appointment. Reviewed pre-eclampsia symtoms and also low bp symptoms. Advised to continue to  check bp at home as needed. She voices understanding. Staci Acosta

## 2022-11-21 ENCOUNTER — Encounter: Payer: Self-pay | Admitting: *Deleted

## 2022-11-30 ENCOUNTER — Encounter: Payer: Self-pay | Admitting: *Deleted

## 2022-12-10 ENCOUNTER — Ambulatory Visit: Payer: Medicaid Other | Admitting: Obstetrics and Gynecology

## 2022-12-10 ENCOUNTER — Ambulatory Visit (HOSPITAL_COMMUNITY): Payer: Medicaid Other | Attending: Cardiology

## 2022-12-10 ENCOUNTER — Encounter (HOSPITAL_COMMUNITY): Payer: Self-pay | Admitting: Cardiology

## 2022-12-31 ENCOUNTER — Encounter: Payer: Self-pay | Admitting: Obstetrics and Gynecology

## 2022-12-31 ENCOUNTER — Ambulatory Visit (INDEPENDENT_AMBULATORY_CARE_PROVIDER_SITE_OTHER): Payer: Medicaid Other | Admitting: Obstetrics and Gynecology

## 2022-12-31 ENCOUNTER — Other Ambulatory Visit: Payer: Self-pay

## 2022-12-31 DIAGNOSIS — Z30017 Encounter for initial prescription of implantable subdermal contraceptive: Secondary | ICD-10-CM

## 2022-12-31 LAB — POCT PREGNANCY, URINE: Preg Test, Ur: NEGATIVE

## 2022-12-31 MED ORDER — ETONOGESTREL 68 MG ~~LOC~~ IMPL
68.0000 mg | DRUG_IMPLANT | Freq: Once | SUBCUTANEOUS | Status: AC
  Administered 2022-12-31: 68 mg via SUBCUTANEOUS

## 2022-12-31 NOTE — Progress Notes (Signed)
Post Partum Visit Note  Theresa Lam is a 27 y.o. [phone] female who presents for a postpartum visit. She is 9 weeks 2 days postpartum following a normal spontaneous vaginal delivery.  I have fully reviewed the prenatal and intrapartum course. The delivery was at 38 gestational weeks and 6days.  Anesthesia: epidural. Postpartum course has been fine. Baby is doing well. Baby is feeding by bottle - Similac Advance. Bleeding staining only. Bowel function is normal. Bladder function is normal. Patient is not sexually active. Contraception method is none. Postpartum depression screening: negative.   The pregnancy intention screening data noted above was reviewed. Potential methods of contraception were discussed. The patient elected to proceed with No data recorded.    Health Maintenance Due  Topic Date Due   COVID-19 Vaccine (1) Never done   HPV VACCINES (1 - 2-dose series) Never done    The following portions of the patient's history were reviewed and updated as appropriate: allergies, current medications, past family history, past medical history, past social history, past surgical history, and problem list.  Review of Systems Pertinent items are noted in HPI.  Objective:  BP 112/74   Pulse 80   Wt 211 lb 6.4 oz (95.9 kg)   Breastfeeding No   BMI 31.22 kg/m    General:  alert and cooperative   Breasts:  normal  Lungs: clear to auscultation bilaterally  Heart:  regular rate and rhythm, S1, S2 normal, no murmur, click, rub or gallop  Abdomen: soft, non-tender; bowel sounds normal; no masses,  no organomegaly   Wound N/A  GU exam:  not indicated       Assessment:   1. Postpartum state  2. Nexplanon insertion  - Pregnancy, urine POC - etonogestrel (NEXPLANON) implant 68 mg   Normal postpartum exam.   Plan:   Essential components of care per ACOG recommendations:  1.  Mood and well being: Patient with negative depression screening today. Reviewed local  resources for support.  - Patient tobacco use? No.  former - hx of drug use? No.    2. Infant care and feeding:  -Patient currently breastmilk feeding? No.  -Social determinants of health (SDOH) reviewed in EPIC. No concerns  3. Sexuality, contraception and birth spacing - Patient does not want a pregnancy in the next year.  Desired family size is 2 children.  - Reviewed reproductive life planning. Reviewed contraceptive methods based on pt preferences and effectiveness.  Patient desired Hormonal Implant today.   - Discussed birth spacing of 18 months  4. Sleep and fatigue -Encouraged family/partner/community support of 4 hrs of uninterrupted sleep to help with mood and fatigue  5. Physical Recovery  - Discussed patients delivery and complications. She describes her labor as good. - Patient had a Vaginal, no problems at delivery. Patient had a  no  laceration. Perineal healing reviewed. Patient expressed understanding - Patient has urinary incontinence? No. - Patient is safe to resume physical and sexual activity  6.  Health Maintenance - HM due items addressed Yes - Last pap smear  Diagnosis  Date Value Ref Range Status  05/11/2022   Final   - Negative for intraepithelial lesion or malignancy (NILM)   Pap smear not done at today's visit.  -Breast Cancer screening indicated? No.   7. Chronic Disease/Pregnancy Condition follow up: None  - PCP follow up  B'Aisha Maricela Bo, CMA [facility] [employer], [facility]   Lorriane Shire, MD, FACOG Minimally Invasive Gynecologic  Surgery  Obstetrics and Gynecology, [facility] for [employer], [facility]

## 2023-01-07 NOTE — Progress Notes (Signed)
    Nexplanon Insertion Procedure Patient identified, informed consent performed, consent signed.   Patient does understand that irregular bleeding is a very common side effect of this medication. She was advised to have backup contraception for one week after placement. Pregnancy test in clinic today was negative.  Appropriate time out taken.  Patient's left arm was prepped and draped in the usual sterile fashion. The area of insertion was noted.  Patient was prepped with alcohol swab and then injected with 3 ml of 1% lidocaine.  She was prepped with betadine, Nexplanon removed from packaging,  Device confirmed in needle, then inserted full length of needle and withdrawn per handbook instructions. Nexplanon was able to palpated in the patient's arm; patient  palpated the insert herself. There was minimal blood loss.  Patient insertion site covered with guaze and a pressure bandage to reduce any bruising.  The patient tolerated the procedure well and was given post procedure instructions.   Lorriane Shire, MD, FACOG Minimally Invasive Gynecologic Surgery  Obstetrics and Gynecology, [facility] for [facility], [facility]

## 2023-01-31 ENCOUNTER — Ambulatory Visit: Payer: Medicaid Other | Attending: Cardiology | Admitting: Cardiology

## 2023-03-19 ENCOUNTER — Ambulatory Visit (INDEPENDENT_AMBULATORY_CARE_PROVIDER_SITE_OTHER): Payer: Medicaid Other | Admitting: General Practice

## 2023-03-19 ENCOUNTER — Other Ambulatory Visit: Payer: Self-pay

## 2023-03-19 VITALS — BP 127/75 | HR 70 | Ht 69.0 in | Wt 211.0 lb

## 2023-03-19 DIAGNOSIS — R339 Retention of urine, unspecified: Secondary | ICD-10-CM | POA: Diagnosis not present

## 2023-03-19 LAB — POCT URINALYSIS DIP (DEVICE)
Bilirubin Urine: NEGATIVE
Glucose, UA: NEGATIVE mg/dL
Ketones, ur: NEGATIVE mg/dL
Nitrite: NEGATIVE
Protein, ur: 30 mg/dL — AB
Specific Gravity, Urine: 1.02 (ref 1.005–1.030)
Urobilinogen, UA: 0.2 mg/dL (ref 0.0–1.0)
pH: 7 (ref 5.0–8.0)

## 2023-03-19 MED ORDER — NITROFURANTOIN MONOHYD MACRO 100 MG PO CAPS: 100.0000 mg | ORAL_CAPSULE | Freq: Two times a day (BID) | ORAL | 0 refills | Status: AC

## 2023-03-19 NOTE — Progress Notes (Signed)
Patient presents to office today reporting incomplete emptying, urinary frequency, urgency & cloudy urine since Saturday. She states this feels similar to when she had a UTI in the past. UA reveals some protein and trace leuks. Will send in Macrobid per protocol. Discussed with patient and urine culture ordered.   Chase Caller RN BSN 03/19/23

## 2023-03-21 LAB — URINE CULTURE

## 2024-05-15 ENCOUNTER — Telehealth: Admitting: Physician Assistant

## 2024-05-15 ENCOUNTER — Telehealth

## 2024-05-15 DIAGNOSIS — B009 Herpesviral infection, unspecified: Secondary | ICD-10-CM | POA: Diagnosis not present

## 2024-05-15 MED ORDER — VALACYCLOVIR HCL 1 G PO TABS: 1000.0000 mg | ORAL_TABLET | Freq: Two times a day (BID) | ORAL | 0 refills | Status: AC

## 2024-05-15 MED ORDER — LIDOCAINE-PRILOCAINE 2.5-2.5 % EX CREA: 1.0000 | TOPICAL_CREAM | CUTANEOUS | 0 refills | Status: AC | PRN

## 2024-05-15 NOTE — Patient Instructions (Signed)
 Theresa Lam, thank you for joining Theresa CHRISTELLA Dickinson, PA-C for today's virtual visit.  While this provider is not your primary care provider (PCP), if your PCP is located in our provider database this encounter information will be shared with them immediately following your visit.   A [facility] MyChart account gives you access to today's visit and all your visits, tests, and labs performed at [facility]  click here if you don't have a [facility] MyChart account or go to [email or web address]  Consent: (Patient) Theresa Lam provided verbal consent for this virtual visit at the beginning of the encounter.  Current Medications:  Current Outpatient Medications:    lidocaine -prilocaine  (EMLA ) cream, Apply 1 Application topically as needed., Disp: 30 g, Rfl: 0   valACYclovir  (VALTREX ) 1000 MG tablet, Take 1 tablet (1,000 mg total) by mouth 2 (two) times daily for 10 days., Disp: 20 tablet, Rfl: 0   Medications ordered in this encounter:  Meds ordered this encounter  Medications   valACYclovir  (VALTREX ) 1000 MG tablet    Sig: Take 1 tablet (1,000 mg total) by mouth 2 (two) times daily for 10 days.    Dispense:  20 tablet    Refill:  0    Supervising Provider:   LAMPTEY, PHILIP O [[id number]]   lidocaine -prilocaine  (EMLA ) cream    Sig: Apply 1 Application topically as needed.    Dispense:  30 g    Refill:  0    Supervising Provider:   BLAISE ALEENE KIDD [[id number]]     *If you need refills on other medications prior to your next appointment, please contact your pharmacy*  Follow-Up: Call back or seek an in-person evaluation if the symptoms worsen or if the condition fails to improve as anticipated.  [facility] [phone]  Other Instructions Genital Herpes Genital herpes is a common sexually transmitted infection (STI) that is caused by a virus. The virus spreads from person to person through contact with a sore, infected saliva, or  infected skin. The virus can cause itching, blisters, and sores around the genitals or rectum. During an outbreak of infection, symptoms may last for several days and then go away. However, the virus remains in the body, so more outbreaks may happen in the future. The time between outbreaks varies and can be from months to years. Genital herpes can affect anyone. It is particularly concerning for pregnant women because the virus can be passed to the baby during delivery. Genital herpes is also a concern for people who have a weak disease-fighting system (immune system). What are the causes? This condition is caused by the herpes simplex virus, type 1 or type 2 (HSV-1 or HSV-2). The virus may spread through: Sexual contact with an infected person, including vaginal, anal, and oral sex. Contact with a herpes sore. The skin. This means that you can get herpes from an infected partner even if there are no blisters or sores present. Your partner may not know that he or she is infected. What increases the risk? You are more likely to develop this condition if: You have sex with many partners. You do not use latex or polyurethane condoms during sex. What are the signs or symptoms? Most people do not have symptoms or they have mild symptoms that may be mistaken for other skin problems. Symptoms may include: Small, red bumps near the genitals, rectum, or mouth. These bumps turn into blisters and then sores. Flu-like (influenza-like) symptoms, including: Fever. Body aches. Swollen lymph  nodes. Headache. Painful urination. Pain and itching in the genital area or rectal area. Vaginal discharge. Tingling or shooting pain in the legs and buttocks. Generally, symptoms are more severe and last longer during the first (primary) outbreak. Influenza-like symptoms are also more common during the primary outbreak. How is this diagnosed? This condition may be diagnosed based on: A physical exam. Your medical  history. Blood tests. A test of a fluid sample (culture) from an open sore. How is this treated? There is no cure for this condition, but treatment with antiviral medicines can do the following: Speed up healing and relieve symptoms. Help to reduce the spread of the virus to sexual partners. Limit the chance of future outbreaks, or make future outbreaks shorter. Lessen symptoms of future outbreaks. Your health care provider may also recommend over-the-counter medicines to help with pain and itching. Follow these instructions at home: If you have an outbreak:  Keep the affected areas dry and clean. Avoid rubbing or touching blisters and sores. If you do touch blisters or sores: Wash your hands thoroughly with soap and water for at least 20 seconds. If soap and water are not available, use an alcohol-based hand sanitizer. Do not touch your eyes afterward. Sexual activity Do not have sexual contact during active outbreaks. Practice safe sex. Herpes can spread even if your partner does not have blisters or sores. Latex or polyurethane condoms and female condoms may help prevent the spread of the herpes virus. Managing pain and discomfort If directed, put ice on the painful area. To do this: Put ice in a plastic bag. Place a towel between your skin and the bag. Leave the ice on for 20 minutes, 2-3 times a day. Remove the ice if your skin turns bright red. This is very important. If you cannot feel pain, heat, or cold, you have a greater risk of damage to the area. If told, take a cool sitz bath to help relieve pain or itching. A sitz bath is a water bath that you take while sitting down in water that is deep enough to cover your hips and buttocks. General instructions Take over-the-counter and prescription medicines only as told by your health care provider. If you were prescribed an antiviral medicine, use it as told by your health care provider. Do not stop using the antiviral even if you  start to feel better. Keep all follow-up visits. This is important. How is this prevented? Use condoms. Although you can get genital herpes during sexual contact even with the use of a condom, a condom can provide some protection. Avoid having multiple sexual partners. Talk with your sexual partner about any symptoms either of you may have. Also, talk with your partner about any history of STIs. Do not have sexual contact if you have active symptoms of genital herpes. Contact a health care provider if: Your symptoms are not improving with medicine. Your symptoms return, or you have new symptoms. You have a fever. You have abdominal pain. You have redness, swelling, or pain in your eye. You notice new sores on other parts of your body. You have had herpes and you become pregnant or plan to become pregnant. Get help right away if: You have symptoms of viral meningitis. This is rare but may happen if the virus spreads to the brain. Symptoms may include: Severe headache or stiff neck. Muscle aches. Nausea and vomiting. Sensitivity to light. Summary Genital herpes is a common sexually transmitted infection (STI) that is caused by the herpes  simplex virus, type 1 or type 2 (HSV-1 or HSV-2). These viruses are most often spread through sexual contact with an infected person. You are more likely to develop this condition if you have sex with many partners or you do not use condoms during sex. Most people do not have symptoms or have mild symptoms that may be mistaken for other skin problems. Symptoms occur as outbreaks that may happen months or years apart. There is no cure for this condition, but treatment with oral antiviral medicines can reduce symptoms, reduce the chance of spreading the virus to a partner, prevent future outbreaks, or shorten future outbreaks. This information is not intended to replace advice given to you by your health care provider. Make sure you discuss any questions you  have with your health care provider. Document Revised: 06/08/2021 Document Reviewed: 06/08/2021 Elsevier Patient Education  2024 Elsevier Inc.   If you have been instructed to have an in-person evaluation today at a local Urgent Care facility, please use the link below. It will take you to a list of all of our available [facility], including address, phone number and hours of operation. Please do not delay care.  [facility]  If you or a family member do not have a primary care provider, use the link below to schedule a visit and establish care. When you choose a [facility] primary care physician or advanced practice provider, you gain a long-term partner in health. Find a Primary Care Provider  Learn more about [facility]'s in-office and virtual care options: [facility] - Get Care Now

## 2024-05-15 NOTE — Progress Notes (Signed)
 Virtual Visit Consent   [name], you are scheduled for a virtual visit with a [facility] provider today. Just as with appointments in the office, your consent must be obtained to participate. Your consent will be active for this visit and any virtual visit you may have with one of our providers in the next 365 days. If you have a MyChart account, a copy of this consent can be sent to you electronically.  As this is a virtual visit, video technology does not allow for your provider to perform a traditional examination. This may limit your provider's ability to fully assess your condition. If your provider identifies any concerns that need to be evaluated in person or the need to arrange testing (such as labs, EKG, etc.), we will make arrangements to do so. Although advances in technology are sophisticated, we cannot ensure that it will always work on either your end or our end. If the connection with a video visit is poor, the visit may have to be switched to a telephone visit. With either a video or telephone visit, we are not always able to ensure that we have a secure connection.  By engaging in this virtual visit, you consent to the provision of healthcare and authorize for your insurance to be billed (if applicable) for the services provided during this visit. Depending on your insurance coverage, you may receive a charge related to this service.  I need to obtain your verbal consent now. Are you willing to proceed with your visit today? GLORISTINE TURRUBIATES has provided verbal consent on 05/15/2024 for a virtual visit (video or telephone). [name], PA-C  Date: 05/15/2024 6:51 PM   Virtual Visit via Video Note   I, [name], connected with  Theresa Lam  ([id number], Sep 02, 1996) on 05/15/24 at  6:45 PM EDT by a video-enabled telemedicine application and verified that I am speaking with the correct person using two identifiers.  Location: Patient:  Virtual Visit Location Patient: Home Provider: Virtual Visit Location Provider: Home Office   I discussed the limitations of evaluation and management by telemedicine and the availability of in person appointments. The patient expressed understanding and agreed to proceed.    History of Present Illness: Theresa Lam is a 28 y.o. who identifies as a female who was assigned female at birth, and is being seen today for HSV outbreak. Does have a history. Recent outbreak started a couple of days ago on vaginal area. Having significant pain.   Problems:  Patient Active Problem List   Diagnosis Date Noted   Severe preeclampsia in postpartum period 11/01/2022   Rh negative state in antepartum period 07/30/2022   Trichomonas vaginalis (TV) infection 05/14/2022   History of herpes genitalis 05/11/2022   Sickle cell trait in mother affecting pregnancy (HCC) 05/11/2022   Supervision of other normal pregnancy, antepartum 04/05/2022    Allergies: No Known Allergies Medications:  Current Outpatient Medications:    lidocaine -prilocaine  (EMLA ) cream, Apply 1 Application topically as needed., Disp: 30 g, Rfl: 0   valACYclovir  (VALTREX ) 1000 MG tablet, Take 1 tablet (1,000 mg total) by mouth 2 (two) times daily for 10 days., Disp: 20 tablet, Rfl: 0  Observations/Objective: Patient is well-developed, well-nourished in no acute distress.  Resting comfortably at home.  Head is normocephalic, atraumatic.  No labored breathing.  Speech is clear and coherent with logical content.  Patient is alert and oriented at baseline.    Assessment and Plan: 1. HSV (herpes simplex  virus) infection (Primary) - valACYclovir  (VALTREX ) 1000 MG tablet; Take 1 tablet (1,000 mg total) by mouth 2 (two) times daily for 10 days.  Dispense: 20 tablet; Refill: 0 - lidocaine -prilocaine  (EMLA ) cream; Apply 1 Application topically as needed.  Dispense: 30 g; Refill: 0  - Valtrex  prescribed for acute outbreak - EMLA   cream for topical pain relief - Cold compresses as needed - Can use Tylenol  and/or Ibuprofen  alternating every 4 hours as needed for pain - Seek in person evaluation if not improving or symptoms worsen  Follow Up Instructions: I discussed the assessment and treatment plan with the patient. The patient was provided an opportunity to ask questions and all were answered. The patient agreed with the plan and demonstrated an understanding of the instructions.  A copy of instructions were sent to the patient via MyChart unless otherwise noted below.    The patient was advised to call back or seek an in-person evaluation if the symptoms worsen or if the condition fails to improve as anticipated.    [name], PA-C

## 2024-09-10 ENCOUNTER — Encounter (HOSPITAL_COMMUNITY): Payer: Self-pay

## 2024-09-10 ENCOUNTER — Emergency Department (HOSPITAL_COMMUNITY)
Admission: EM | Admit: 2024-09-10 | Discharge: 2024-09-10 | Disposition: A | Discharge status: Home / Self Care | Attending: Emergency Medicine | Admitting: Emergency Medicine

## 2024-09-10 ENCOUNTER — Other Ambulatory Visit: Payer: Self-pay

## 2024-09-10 DIAGNOSIS — J101 Influenza due to other identified influenza virus with other respiratory manifestations: Secondary | ICD-10-CM | POA: Insufficient documentation

## 2024-09-10 DIAGNOSIS — R059 Cough, unspecified: Secondary | ICD-10-CM | POA: Diagnosis present

## 2024-09-10 LAB — RESP PANEL BY RT-PCR (RSV, FLU A&B, COVID)  RVPGX2
Influenza A by PCR: POSITIVE — AB
Influenza B by PCR: NEGATIVE
Resp Syncytial Virus by PCR: NEGATIVE
SARS Coronavirus 2 by RT PCR: NEGATIVE

## 2024-09-10 LAB — GROUP A STREP BY PCR: Group A Strep by PCR: NOT DETECTED

## 2024-09-10 LAB — POC URINE PREG, ED: Preg Test, Ur: NEGATIVE

## 2024-09-10 MED ORDER — ACETAMINOPHEN 325 MG PO TABS
650.0000 mg | ORAL_TABLET | Freq: Once | ORAL | Status: AC | PRN
  Administered 2024-09-10: 650 mg via ORAL
  Filled 2024-09-10: qty 2

## 2024-09-10 MED ORDER — PROCHLORPERAZINE EDISYLATE 10 MG/2ML IJ SOLN
10.0000 mg | Freq: Once | INTRAMUSCULAR | Status: AC
  Administered 2024-09-10: 10 mg via INTRAVENOUS
  Filled 2024-09-10: qty 2

## 2024-09-10 MED ORDER — KETOROLAC TROMETHAMINE 15 MG/ML IJ SOLN
15.0000 mg | Freq: Once | INTRAMUSCULAR | Status: AC
  Administered 2024-09-10: 15 mg via INTRAVENOUS
  Filled 2024-09-10: qty 1

## 2024-09-10 MED ORDER — LACTATED RINGERS IV BOLUS
1000.0000 mL | Freq: Once | INTRAVENOUS | Status: AC
  Administered 2024-09-10: 1000 mL via INTRAVENOUS

## 2024-09-10 NOTE — ED Triage Notes (Signed)
 Pt to er, pt states that yesterday she had a headache, and today she has a fever, congestion and feels like she has a head cold.

## 2024-09-10 NOTE — Discharge Instructions (Addendum)
 You were seen today for flu-like symptoms. While you were here we monitored your vitals, preformed a physical exam, and got labs. These were all reassuring and there is no indication for any further testing or intervention in the emergency department at this time.   Things to do:  - Follow up with your primary care provider within the next 1-2 weeks - Please use Tylenol  and Ibuprofen  at home for body aches/ headaches/fevers  Return to the emergency department if you have any new or worsening symptoms including worsning body aches or headaches, chest pain, shortness of breath, difficulty tolerating p.o., fevers not amenable to Tylenol  or Ibuprofen , or if you have any other concerns.

## 2024-09-10 NOTE — ED Provider Notes (Signed)
 " [facility] Provider Note   HPI/ROS    History obtained from patient.  Theresa Lam is a 28 y.o. female who presents for Cough and who  has a past medical history of HSV-2 infection and Sickle cell trait.  Patient presents today for cough and generalized bodyaches.  States that the cough started yesterday with a headache and today has progressed to generalized bodyaches as well as fevers and congestion.  States that she was with her niece recently who was sick.  Denies any chest pain, shortness breath, nausea, vomiting, or diarrhea.  Denies any visual changes associated with the headache.  No numbness or tingling in the extremities.  She states she feels like a head cold.  MDM   I have reviewed the nursing documentation, vital signs, as well as the past medical history, surgical history, family history, and social history.  Initial Assessment:  Mildly tachycardic but otherwise hemodynamically stable on initial evaluation.  Has some oropharyngeal erythema without signs of obvious tonsillar exudate.  Strep test here negative.  Neck pregnancy test negative.  Does have positive flu a swab.  Will give patient Tylenol  for fevers.  Most likely viral etiology given generalized bodyaches and positive flu test.  Lower concern for pneumonia given short duration and no cough productive of sputum.  No concerns for UTI given no dysuria or increased frequency.  No signs of meningitis.  Patient still feeling unwell after Tylenol .  Will give Toradol , Compazine , and a liter of LR.  Patient feeling better after fluids and medications.  Given positive flu test with no history of asthma or other immunocompromise states feel patient stable for discharge.  Afebrile with improvement in heart rate.  Will not do Tamiflu given patient is not high risk.  Patient instructed to use Tylenol  Motrin  at home and follow-up with the PCP within a week.  Patient discharged home in  hemodynamically stable condition with strict return precautions.  Disposition:  I discussed the plan for discharge with the patient and/or their surrogate at bedside prior to discharge and they were in agreement with the plan and verbalized understanding of the return precautions provided. All questions answered to the best of my ability. Ultimately, the patient was discharged in stable condition with stable vital signs. I am reassured that they are capable of close follow up and good social support at home.   This patient was staffed with Dr. Dreama who supervised the visit and agreed with the plan of care.   Due to the patients current presenting symptoms, physical exam findings, and the workup stated above, it is thought that the etiology of the patients current presentation is:  1. Influenza A     Clinical Complexity A medically appropriate history, review of systems, and physical exam was performed.  Factors that affect the complexity of this encounter: assessment of correct protocol and laboratory work from this visit  My independent interpretations of diagnostic studies are documented in the ED course above.   If decision rules were used in this patient's evaluation, they are listed below.   Click here for ABCD2, HEART and other calculators  Patient's presentation is most consistent with acute illness / injury with system symptoms.  MDM generated using voice dictation software and may contain dictation errors. Please contact me for any clarification or with any questions.    Physical Exam, PMH, PSH, Family History, and Social Hsitory   Vitals:   09/10/24 2145 09/10/24 2200 09/10/24 2245  09/10/24 2259  BP:  120/74 119/67   Pulse:  (!) 102 (!) 110   Resp:   18   Temp: (!) 101.7 F (38.7 C)   98.8 F (37.1 C)  TempSrc: Oral   Oral  SpO2:  99% 98%   Weight:      Height:        Physical Exam Constitutional:      Appearance: Normal appearance.  HENT:     Nose:  Nose normal.     Mouth/Throat:     Mouth: Mucous membranes are moist.     Pharynx: Oropharynx is clear.     Tonsils: No tonsillar exudate or tonsillar abscesses. 2+ on the right. 2+ on the left.  Cardiovascular:     Rate and Rhythm: Normal rate and regular rhythm.     Pulses: Normal pulses.  Pulmonary:     Effort: Pulmonary effort is normal.     Breath sounds: Normal breath sounds. No wheezing or rales.  Abdominal:     General: Abdomen is flat.     Palpations: Abdomen is soft.     Tenderness: There is no abdominal tenderness. There is no guarding or rebound.  Musculoskeletal:     Cervical back: Normal range of motion. No rigidity.     Right lower leg: No edema.     Left lower leg: No edema.  Skin:    General: Skin is warm and dry.     Capillary Refill: Capillary refill takes less than 2 seconds.  Neurological:     General: No focal deficit present.     Mental Status: She is alert and oriented to person, place, and time.     Past Medical History:  Diagnosis Date   HSV-2 infection    Sickle cell trait      Past Surgical History:  Procedure Laterality Date   NO PAST SURGERIES       Family History  Problem Relation Age of Onset   Healthy Mother    Cancer Father    Hypertension Maternal Grandmother    Diabetes Paternal Grandfather     Social History   Tobacco Use   Smoking status: Former    Types: Cigars    Quit date: 2023    Years since quitting: 2.9   Smokeless tobacco: Never  Substance Use Topics   Alcohol use: Not Currently    Comment: last 10/27/21     Procedures   If procedures were preformed on this patient, they are listed below:  Procedures   Electronically signed by:   Glendia Carlin Ancona, M.D. PGY-2, Emergency Medicine   Please note that this documentation was produced with the assistance of voice-to-text technology and may contain errors.    Ancona Glendia, MD 09/10/24 [phone]  "

## 2024-10-10 ENCOUNTER — Other Ambulatory Visit: Payer: Self-pay

## 2024-10-10 ENCOUNTER — Ambulatory Visit (HOSPITAL_COMMUNITY)
Admission: EM | Admit: 2024-10-10 | Discharge: 2024-10-10 | Disposition: A | Discharge status: Home / Self Care | Attending: Family Medicine | Admitting: Family Medicine

## 2024-10-10 ENCOUNTER — Encounter (HOSPITAL_COMMUNITY): Payer: Self-pay | Admitting: Emergency Medicine

## 2024-10-10 DIAGNOSIS — L0291 Cutaneous abscess, unspecified: Secondary | ICD-10-CM

## 2024-10-10 MED ORDER — IBUPROFEN 800 MG PO TABS: 800.0000 mg | ORAL_TABLET | Freq: Three times a day (TID) | ORAL | 0 refills | Status: AC | PRN

## 2024-10-10 MED ORDER — LIDOCAINE-EPINEPHRINE 1 %-1:100000 IJ SOLN
INTRAMUSCULAR | Status: AC
  Filled 2024-10-10: qty 1

## 2024-10-10 MED ORDER — SULFAMETHOXAZOLE-TRIMETHOPRIM 800-160 MG PO TABS: 1.0000 | ORAL_TABLET | Freq: Two times a day (BID) | ORAL | 0 refills | Status: AC

## 2024-10-10 NOTE — ED Triage Notes (Signed)
 No history of this .  Noted a tiny bump approx 2-3 days ago.  Bump has grown since being noticed.  Describes bump as being right groin, outside of right labia.  Tylenol  is not helping.    Patient does shave

## 2024-10-10 NOTE — ED Provider Notes (Signed)
 " [facility]    CSN: [id number] Arrival date & time: 10/10/24  9076      History   Chief Complaint Chief Complaint  Patient presents with   Abscess    HPI Theresa Lam is a 29 y.o. female.   This 29 year old female is being seen for complaints of abscess in her groin.  She reports approximately 3 days ago she noticed a small bump which has progressively worsened.  She reports it is very painful.  She is taking Tylenol  without relief of pain.  She denies drainage or odor from the area.  She denies fever, chills, headache, dizziness.  She denies chest pain, shortness of breath.  She denies abdominal pain, nausea, vomiting.  She denies urinary symptoms including dysuria, urgency, or frequency.   Abscess Associated symptoms: no fever, no headaches, no nausea and no vomiting     Past Medical History:  Diagnosis Date   HSV-2 infection    Sickle cell trait     Patient Active Problem List   Diagnosis Date Noted   Severe preeclampsia in postpartum period 11/01/2022   Rh negative state in antepartum period 07/30/2022   Trichomonas vaginalis (TV) infection 05/14/2022   History of herpes genitalis 05/11/2022   Sickle cell trait in mother affecting pregnancy 05/11/2022   Supervision of other normal pregnancy, antepartum 04/05/2022    Past Surgical History:  Procedure Laterality Date   NO PAST SURGERIES      OB History     Gravida  3   Para  2   Term  2   Preterm      AB  1   Living  2      SAB      IAB      Ectopic      Multiple  0   Live Births  2            Home Medications    Prior to Admission medications  Medication Sig Start Date End Date Taking? Authorizing Provider  ibuprofen  (ADVIL ) 800 MG tablet Take 1 tablet (800 mg total) by mouth every 8 (eight) hours as needed. 10/10/24  Yes Essense Bousquet C, FNP  sulfamethoxazole -trimethoprim  (BACTRIM  DS) 800-160 MG tablet Take 1 tablet by mouth 2 (two) times daily for 7 days.  10/10/24 10/17/24 Yes Kalandra Masters C, FNP  lidocaine -prilocaine  (EMLA ) cream Apply 1 Application topically as needed. 05/15/24   Vivienne Delon HERO, PA-C    Family History Family History  Problem Relation Age of Onset   Healthy Mother    Cancer Father    Hypertension Maternal Grandmother    Diabetes Paternal Grandfather     Social History Social History[1]   Allergies   Patient has no known allergies.   Review of Systems Review of Systems  Constitutional:  Negative for chills and fever.  Respiratory:  Negative for shortness of breath.   Cardiovascular:  Negative for chest pain.  Gastrointestinal:  Negative for abdominal pain, nausea and vomiting.  Genitourinary:  Positive for genital sores (abscess right labia majora). Negative for difficulty urinating, dysuria, frequency, pelvic pain, urgency, vaginal bleeding, vaginal discharge and vaginal pain.  Skin:  Positive for wound. Negative for color change and rash.  Neurological:  Negative for dizziness and headaches.  All other systems reviewed and are negative.    Physical Exam Triage Vital Signs ED Triage Vitals  Encounter Vitals Group     BP 10/10/24 1101 117/80     Girls Systolic BP Percentile --  Girls Diastolic BP Percentile --      Boys Systolic BP Percentile --      Boys Diastolic BP Percentile --      Pulse Rate 10/10/24 1101 81     Resp 10/10/24 1101 18     Temp 10/10/24 1101 98.1 F (36.7 C)     Temp Source 10/10/24 1101 Oral     SpO2 10/10/24 1101 96 %     Weight --      Height --      Head Circumference --      Peak Flow --      Pain Score 10/10/24 1058 10     Pain Loc --      Pain Education --      Exclude from Growth Chart --    No data found.  Updated Vital Signs BP 117/80 (BP Location: Left Arm)   Pulse 81   Temp 98.1 F (36.7 C) (Oral)   Resp 18   LMP 09/21/2024 (Approximate)   SpO2 96%   Visual Acuity Right Eye Distance:   Left Eye Distance:   Bilateral Distance:    Right  Eye Near:   Left Eye Near:    Bilateral Near:     Physical Exam Vitals and nursing note reviewed.  Constitutional:      General: She is not in acute distress.    Appearance: She is well-developed. She is not toxic-appearing.     Comments: Pleasant female appearing stated age found sitting in chair in no acute distress.  Cardiovascular:     Rate and Rhythm: Normal rate and regular rhythm.     Heart sounds: Normal heart sounds. No murmur heard. Pulmonary:     Effort: Pulmonary effort is normal. No respiratory distress.     Breath sounds: Normal breath sounds.  Genitourinary:    Labia:        Right: Lesion (abscess right labia majora) present.   Skin:    General: Skin is warm and dry.     Findings: Abscess present.  Neurological:     Mental Status: She is alert.  Psychiatric:        Mood and Affect: Mood normal.      UC Treatments / Results  Labs (all labs ordered are listed, but only abnormal results are displayed) Labs Reviewed - No data to display  EKG   Radiology No results found.  Procedures Incision and Drainage  Date/Time: 10/10/2024 12:36 PM  Performed by: Lennice Jon BROCKS, FNP Authorized by: Lennice Jon BROCKS, FNP   Consent:    Consent obtained:  Verbal   Consent given by:  Patient   Risks, benefits, and alternatives were discussed: yes     Risks discussed:  Pain, infection, incomplete drainage and bleeding   Alternatives discussed:  No treatment Universal protocol:    Patient identity confirmed:  Verbally with patient Location:    Type:  Abscess   Size:  3cm   Location:  Anogenital   Anogenital location: right labia majora. Pre-procedure details:    Skin preparation:  Povidone-iodine Anesthesia:    Anesthesia method:  Local infiltration   Local anesthetic:  Lidocaine  1% WITH epi Procedure type:    Complexity:  Complex Procedure details:    Incision types:  Stab incision   Wound management:  Probed and deloculated   Drainage:  Purulent    Drainage amount:  Copious   Wound treatment:  Wound left open   Packing materials:  None Post-procedure details:  Procedure completion:  Tolerated well, no immediate complications  (including critical care time)  Medications Ordered in UC Medications - No data to display  Initial Impression / Assessment and Plan / UC Course  I have reviewed the triage vital signs and the nursing notes.  Pertinent labs & imaging results that were available during my care of the patient were reviewed by me and considered in my medical decision making (see chart for details).     Vitals in triage reviewed, patient is hemodynamically stable.  She presents with fluctuant abscess right labia majora.  See incision and drainage note above for further detail regarding incision and drainage procedure, patient tolerated well.   Wound cleansed and dressed in clinic. Wound care discussed (warm compresses, dressing changes, etc).  Bactrim  antibiotic and ibuprofen  ordered and supportive care discussed/outlined in AVS.  Plan of care, follow-up care, return precautions given, no questions at this time. Final Clinical Impressions(s) / UC Diagnoses   Final diagnoses:  Abscess     Discharge Instructions      We drained your abscess today in the clinic and left open to continue to drain.  Continue to use warm compresses to the wound to further encourage drainage from the wound.  You may do this in the shower as well with gentle compresses to the area to allow more infected material to drain.   Take Bactrim  (antibiotic) as prescribed to treat infection to the abscess.  Take ibuprofen  800mg  every 8 hours as needed for pain. Take Tylenol  as needed.  Change your dressings frequently as the wound heals.   You may take over the counter pain medicines as needed for pain once the numbing wears off.  If you notice any worsening signs of infection such as redness, swelling, fever, or worsening drainage, please return to  urgent care for reevaluation.       ED Prescriptions     Medication Sig Dispense Auth. Provider   sulfamethoxazole -trimethoprim  (BACTRIM  DS) 800-160 MG tablet Take 1 tablet by mouth 2 (two) times daily for 7 days. 14 tablet Latrina Guttman C, FNP   ibuprofen  (ADVIL ) 800 MG tablet Take 1 tablet (800 mg total) by mouth every 8 (eight) hours as needed. 21 tablet Oracio Galen C, FNP      PDMP not reviewed this encounter.     [1]  Social History Tobacco Use   Smoking status: Former    Types: Cigars    Quit date: 2023    Years since quitting: 3.0   Smokeless tobacco: Never  Vaping Use   Vaping status: Never Used  Substance Use Topics   Alcohol use: Not Currently    Comment: last 10/27/21   Drug use: Not Currently    Types: Marijuana     Lennice Jon BROCKS, FNP 10/10/24 1241  "

## 2024-10-10 NOTE — Discharge Instructions (Addendum)
 We drained your abscess today in the clinic and left open to continue to drain.  Continue to use warm compresses to the wound to further encourage drainage from the wound.  You may do this in the shower as well with gentle compresses to the area to allow more infected material to drain.   Take Bactrim  (antibiotic) as prescribed to treat infection to the abscess.  Take ibuprofen  800mg  every 8 hours as needed for pain. Take Tylenol  as needed.  Change your dressings frequently as the wound heals.   You may take over the counter pain medicines as needed for pain once the numbing wears off.  If you notice any worsening signs of infection such as redness, swelling, fever, or worsening drainage, please return to urgent care for reevaluation.
